# Patient Record
Sex: Female | Born: 1985 | Race: White | Hispanic: No | Marital: Married | Smoking: Never smoker
Health system: Southern US, Community
[De-identification: ages and names within clinical notes are randomized; demographics above are authoritative.]

## PROBLEM LIST (undated history)

## (undated) DIAGNOSIS — F909 Attention-deficit hyperactivity disorder, unspecified type: Secondary | ICD-10-CM

## (undated) HISTORY — DX: Attention-deficit hyperactivity disorder, unspecified type: F90.9

---
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
## 2011-03-09 ENCOUNTER — Emergency Department: Payer: Self-pay | Admitting: Emergency Medicine

## 2013-05-05 IMAGING — CT CT HEAD WITHOUT AND WITH CONTRAST
1 of 2 series · 13 of 30 positions shown, 17 images · IV contrast (agent unspecified)
Comparison: none

REASON FOR EXAM: acute change in vision both eyes, "severe headache (last
night), unsteady gait
COMMENTS:

PROCEDURE:     CT  - CT HEAD W/WO  - March 09, 2011  [DATE]
RESULT:
TECHNIQUE: Pre and post contrast CT of the brain is performed.
There is no previous exam for comparison.

[Series 2: without · axial · non-contrast · 0.39mm/px · z∈[-124,+12]mm · 13 of 33 slices shown, 17 images]
[im 3/33  brain]
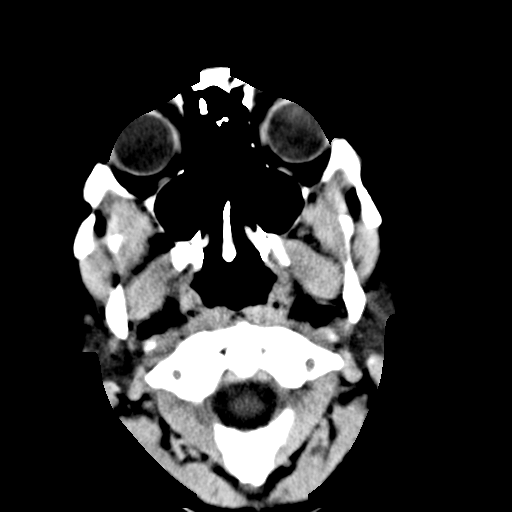
[im 3/33  bone]
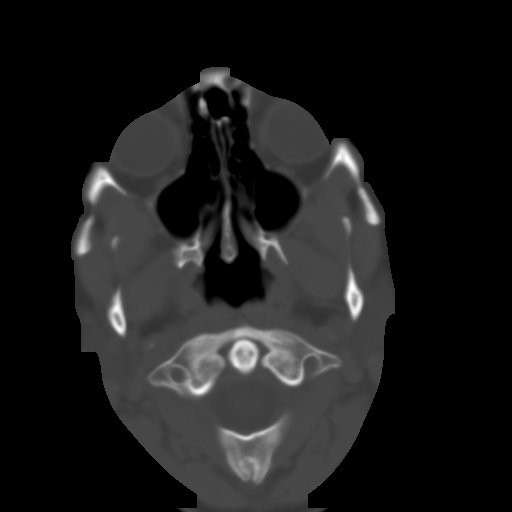
[im 5/33  brain]
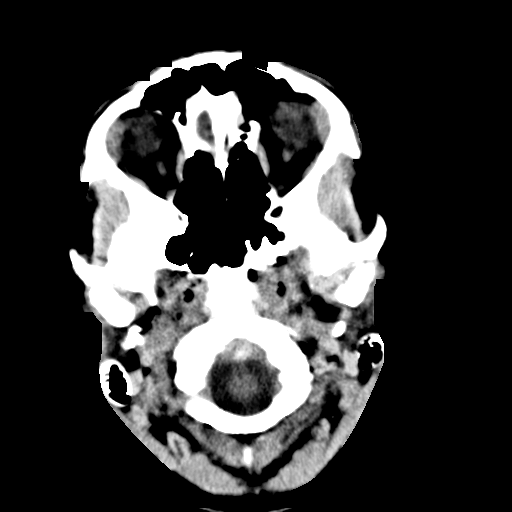
[im 7/33  brain]
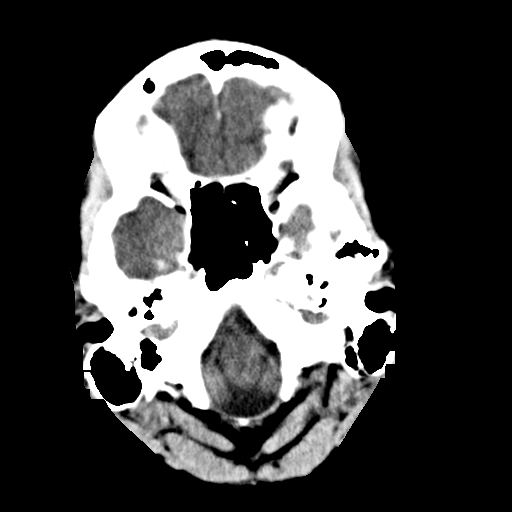
[im 10/33  brain]
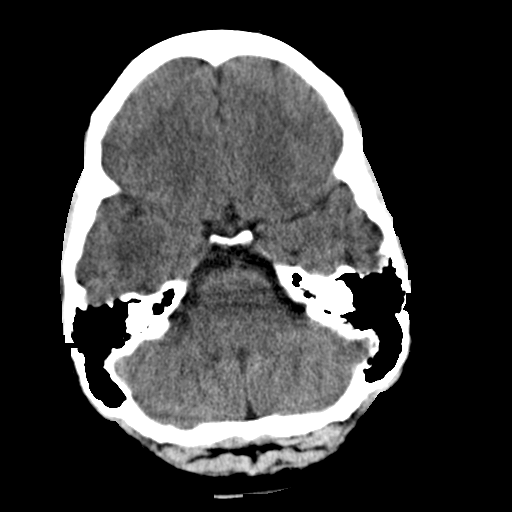
[im 12/33  brain]
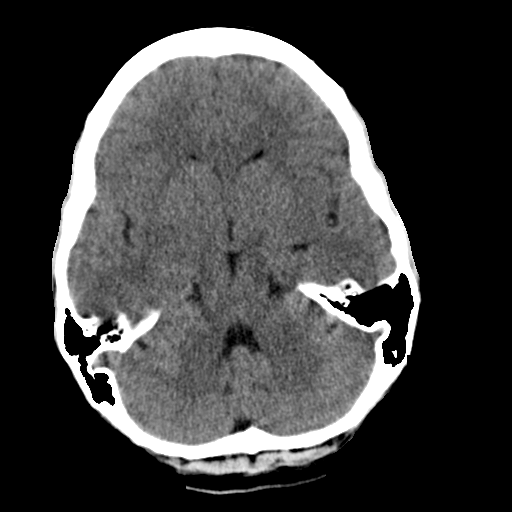
[im 12/33  bone]
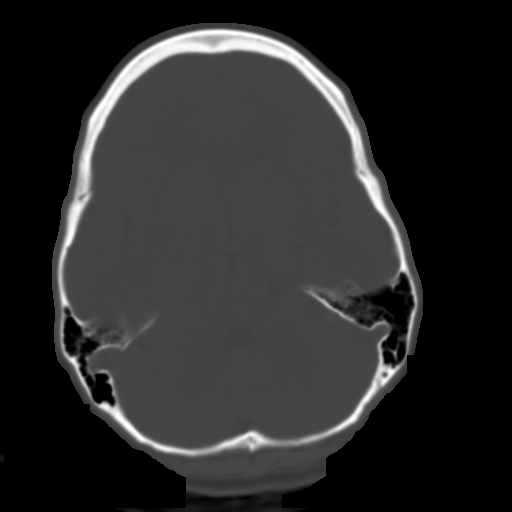
[im 14/33  brain]
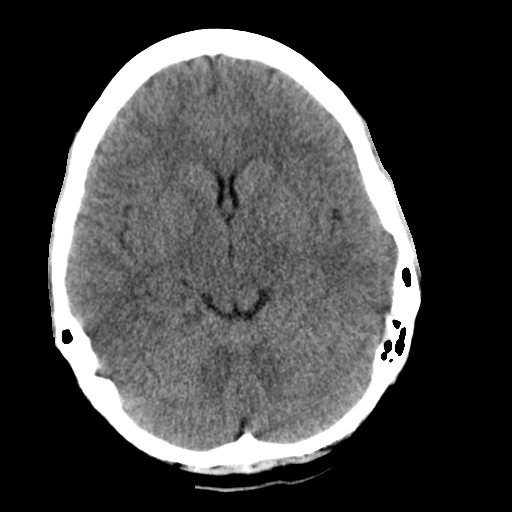
[im 17/33  brain]
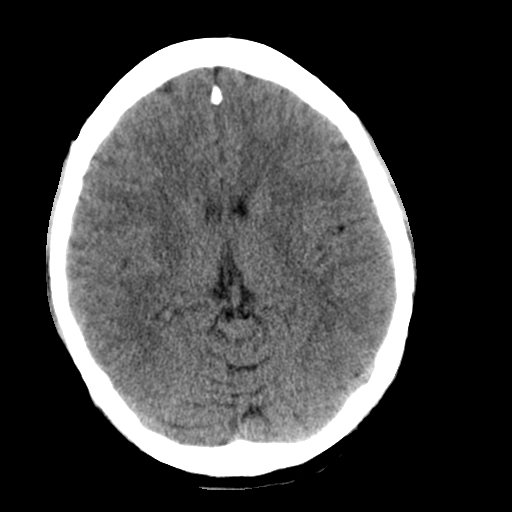
[im 19/33  brain]
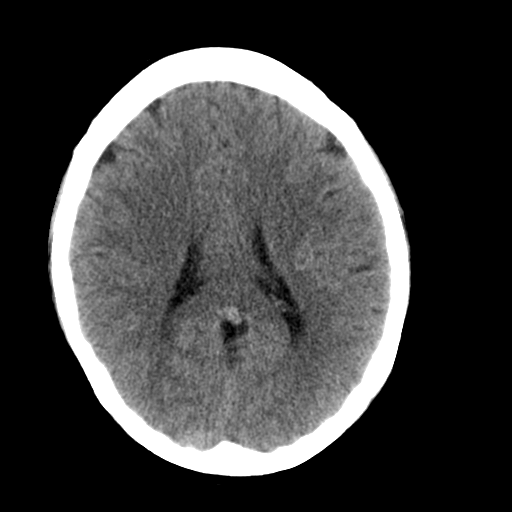
[im 21/33  brain]
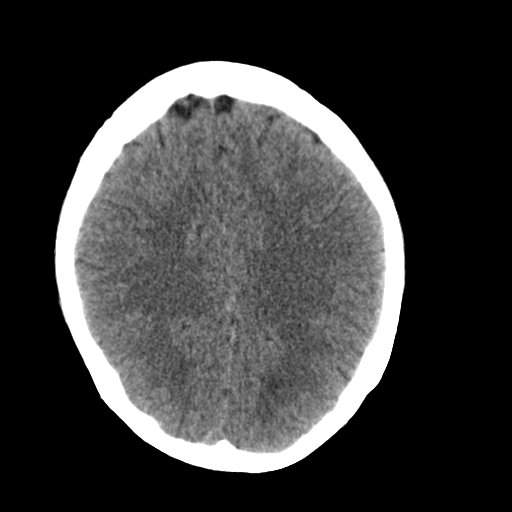
[im 21/33  bone]
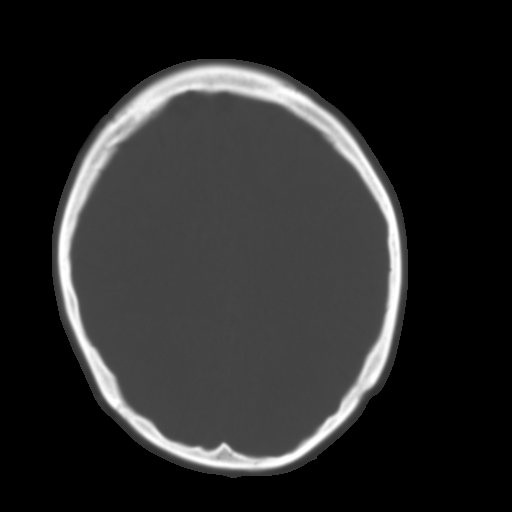
[im 23/33  brain]
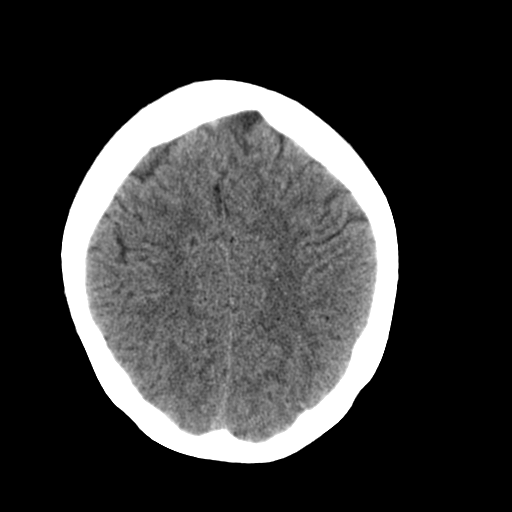
[im 26/33  brain]
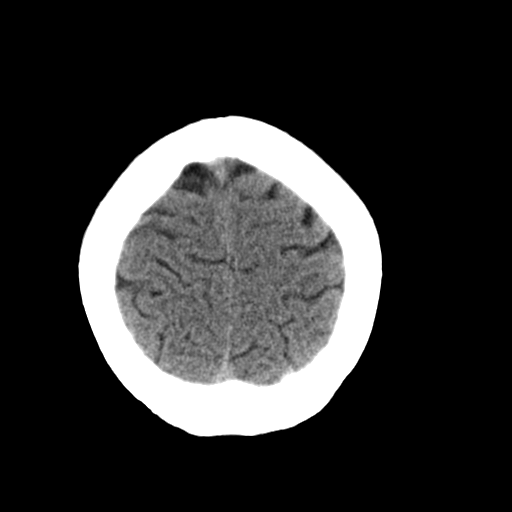
[im 28/33  brain]
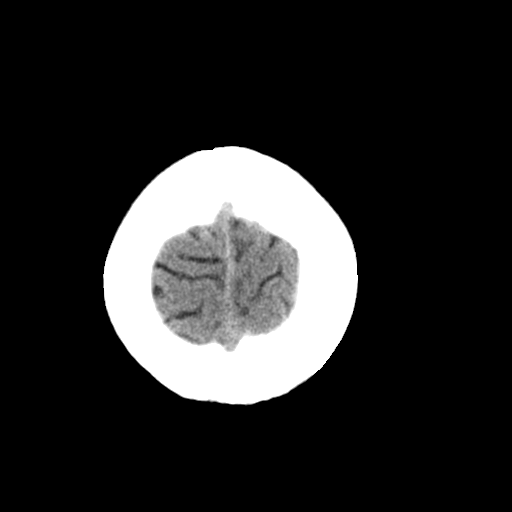
[im 30/33  brain]
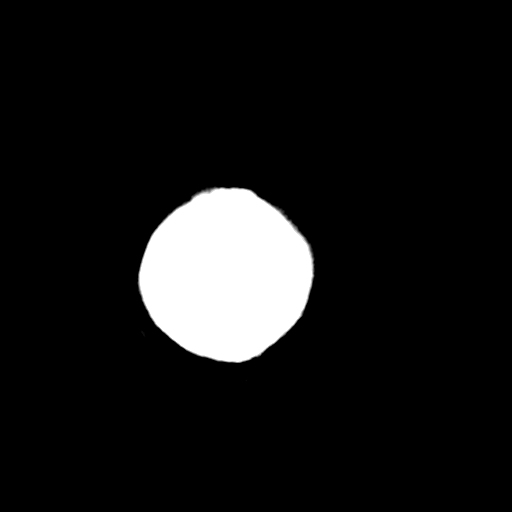
[im 30/33  bone]
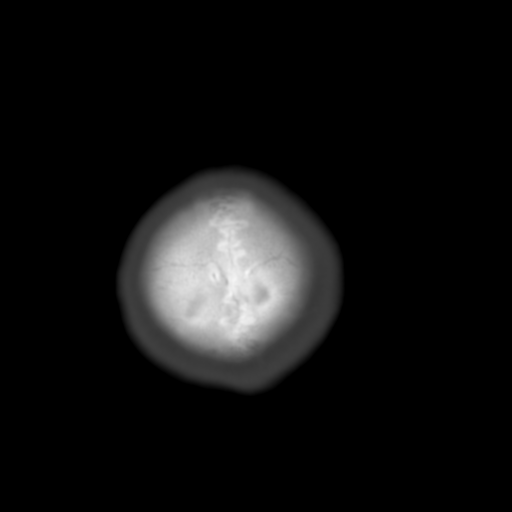

[13 of 30 positions shown; findings below may reference images not displayed]

FINDINGS: The sinuses are clear. The calvarium is intact. There is no
hemorrhage, mass, mass effect or midline shift. There is no territorial
infarct. No abnormal enhancement is present to suggest underlying mass.
There is no evidence of an aneurysm. Minimal calcification is seen along the
anterior falx cerebri.
IMPRESSION: No acute intracranial abnormality.

## 2014-11-13 ENCOUNTER — Encounter: Payer: Self-pay | Admitting: *Deleted

## 2014-11-18 ENCOUNTER — Encounter: Payer: Self-pay | Admitting: Obstetrics and Gynecology

## 2014-12-02 ENCOUNTER — Encounter: Payer: Self-pay | Admitting: Obstetrics and Gynecology

## 2014-12-02 ENCOUNTER — Other Ambulatory Visit: Payer: Self-pay | Admitting: Obstetrics and Gynecology

## 2014-12-02 ENCOUNTER — Ambulatory Visit (INDEPENDENT_AMBULATORY_CARE_PROVIDER_SITE_OTHER): Payer: 59 | Admitting: Obstetrics and Gynecology

## 2014-12-02 VITALS — BP 92/70 | HR 88 | Ht 66.0 in | Wt 149.5 lb

## 2014-12-02 DIAGNOSIS — Z01419 Encounter for gynecological examination (general) (routine) without abnormal findings: Secondary | ICD-10-CM

## 2014-12-02 NOTE — Patient Instructions (Signed)
  Place annual gynecologic exam patient instructions here.  Thank you for enrolling in [facility]. Please follow the instructions below to securely access your online medical record. MyChart allows you to send messages to your doctor, view your test results, manage appointments, and more.   How Do I Sign Up? 1. In your Internet browser, go to [employer] and enter [email or web address]. 2. Click on the Sign Up Now link in the Sign In box. You will see the New Member Sign Up page. 3. Enter your MyChart Access Code exactly as it appears below. You will not need to use this code after you've completed the sign-up process. If you do not sign up before the expiration date, you must request a new code.  MyChart Access Code: [email or web address] Expires: 01/31/2015  9:19 AM  4. Enter your Social Security Number ([id number]) and Date of Birth (mm/dd/yyyy) as indicated and click Submit. You will be taken to the next sign-up page. 5. Create a MyChart ID. This will be your MyChart login ID and cannot be changed, so think of one that is secure and easy to remember. 6. Create a MyChart password. You can change your password at any time. 7. Enter your Password Reset Question and Answer. This can be used at a later time if you forget your password.  8. Enter your e-mail address. You will receive e-mail notification when new information is available in [place]. 9. Click Sign Up. You can now view your medical record.   Additional Information Remember, MyChart is NOT to be used for urgent needs. For medical emergencies, dial 911.

## 2014-12-02 NOTE — Progress Notes (Signed)
  Subjective:     Ashley Mathis is a 29 y.o. female and is here for a comprehensive physical exam. The patient reports no problems.  Social History   Social History  . Marital Status: Married    Spouse Name: N/A  . Number of Children: N/A  . Years of Education: N/A   Occupational History  . Not on file.   Social History Main Topics  . Smoking status: Never Smoker   . Smokeless tobacco: Not on file  . Alcohol Use: Yes  . Drug Use: No  . Sexual Activity: Yes    Birth Control/ Protection: Condom   Other Topics Concern  . Not on file   Social History Narrative   Health Maintenance  Topic Date Due  . HIV Screening  06/23/2001  . TETANUS/TDAP  06/23/2005  . INFLUENZA VACCINE  11/10/2014    The following portions of the patient's history were reviewed and updated as appropriate: allergies, current medications, past family history, past medical history, past social history, past surgical history and problem list.  Review of Systems A comprehensive review of systems was negative.   Objective:    General appearance: alert, cooperative and appears stated age Neck: no adenopathy, no carotid bruit, no JVD, supple, symmetrical, trachea midline and thyroid not enlarged, symmetric, no tenderness/mass/nodules Lungs: clear to auscultation bilaterally Breasts: normal appearance, no masses or tenderness Heart: regular rate and rhythm, S1, S2 normal, no murmur, click, rub or gallop Abdomen: soft, non-tender; bowel sounds normal; no masses,  no organomegaly Pelvic: cervix normal in appearance, external genitalia normal, no adnexal masses or tenderness, no cervical motion tenderness, rectovaginal septum normal, uterus normal size, shape, and consistency and vagina normal without discharge    Assessment:    Healthy female exam. Condom use      Plan:  Routine pap obtained RTC 1 year or prn   See After Visit Summary for Counseling Recommendations

## 2014-12-03 LAB — CYTOLOGY - PAP

## 2014-12-04 ENCOUNTER — Encounter: Payer: Self-pay | Admitting: *Deleted

## 2015-07-06 DIAGNOSIS — D485 Neoplasm of uncertain behavior of skin: Secondary | ICD-10-CM | POA: Diagnosis not present

## 2015-07-06 DIAGNOSIS — D1801 Hemangioma of skin and subcutaneous tissue: Secondary | ICD-10-CM | POA: Diagnosis not present

## 2015-10-10 ENCOUNTER — Telehealth (HOSPITAL_BASED_OUTPATIENT_CLINIC_OR_DEPARTMENT_OTHER): Payer: Self-pay

## 2015-10-10 NOTE — Telephone Encounter (Signed)
Post ED Visit - Positive Culture Follow-up  Culture report reviewed by antimicrobial stewardship pharmacist:  []  Elenor Quinones, Pharm.D. []  Heide Guile, Pharm.D., BCPS []  Parks Neptune, Pharm.D. []  Alycia Rossetti, Pharm.D., BCPS []  [place], Pharm.D., BCPS, AAHIVP []  Legrand Como, Pharm.D., BCPS, AAHIVP [x]  Milus Glazier, Pharm.D. []  Stephens November, Pharm.D.  Positive urine culture Treated with Sulfamethoxazole-Trimethoprim, organism sensitive to the same and no further patient follow-up is required at this time.  Genia Del 10/10/2015, 2:25 PM

## 2015-12-03 ENCOUNTER — Encounter: Payer: 59 | Admitting: Obstetrics and Gynecology

## 2016-03-07 DIAGNOSIS — Z3161 Procreative counseling and advice using natural family planning: Secondary | ICD-10-CM | POA: Diagnosis not present

## 2016-03-07 DIAGNOSIS — Z01419 Encounter for gynecological examination (general) (routine) without abnormal findings: Secondary | ICD-10-CM | POA: Diagnosis not present

## 2016-05-07 ENCOUNTER — Telehealth: Payer: 59 | Admitting: Nurse Practitioner

## 2016-05-07 DIAGNOSIS — J111 Influenza due to unidentified influenza virus with other respiratory manifestations: Secondary | ICD-10-CM

## 2016-05-07 MED ORDER — OSELTAMIVIR PHOSPHATE 75 MG PO CAPS: 75.0000 mg | ORAL_CAPSULE | Freq: Two times a day (BID) | ORAL | 0 refills | Status: DC

## 2016-05-07 NOTE — Progress Notes (Signed)

## 2016-06-29 DIAGNOSIS — H5213 Myopia, bilateral: Secondary | ICD-10-CM | POA: Diagnosis not present

## 2016-08-26 DIAGNOSIS — Z0183 Encounter for blood typing: Secondary | ICD-10-CM | POA: Diagnosis not present

## 2016-08-26 DIAGNOSIS — Z3401 Encounter for supervision of normal first pregnancy, first trimester: Secondary | ICD-10-CM | POA: Diagnosis not present

## 2016-10-31 DIAGNOSIS — Z113 Encounter for screening for infections with a predominantly sexual mode of transmission: Secondary | ICD-10-CM | POA: Diagnosis not present

## 2016-10-31 DIAGNOSIS — Z118 Encounter for screening for other infectious and parasitic diseases: Secondary | ICD-10-CM | POA: Diagnosis not present

## 2016-10-31 DIAGNOSIS — Z3402 Encounter for supervision of normal first pregnancy, second trimester: Secondary | ICD-10-CM | POA: Diagnosis not present

## 2016-10-31 DIAGNOSIS — Z369 Encounter for antenatal screening, unspecified: Secondary | ICD-10-CM | POA: Diagnosis not present

## 2016-11-09 DIAGNOSIS — Z3A21 21 weeks gestation of pregnancy: Secondary | ICD-10-CM | POA: Diagnosis not present

## 2016-11-09 DIAGNOSIS — Z3689 Encounter for other specified antenatal screening: Secondary | ICD-10-CM | POA: Diagnosis not present

## 2016-12-27 DIAGNOSIS — Z131 Encounter for screening for diabetes mellitus: Secondary | ICD-10-CM | POA: Diagnosis not present

## 2016-12-27 DIAGNOSIS — Z3403 Encounter for supervision of normal first pregnancy, third trimester: Secondary | ICD-10-CM | POA: Diagnosis not present

## 2017-02-01 DIAGNOSIS — Z23 Encounter for immunization: Secondary | ICD-10-CM | POA: Diagnosis not present

## 2017-02-01 DIAGNOSIS — Z3403 Encounter for supervision of normal first pregnancy, third trimester: Secondary | ICD-10-CM | POA: Diagnosis not present

## 2017-02-07 DIAGNOSIS — M9902 Segmental and somatic dysfunction of thoracic region: Secondary | ICD-10-CM | POA: Diagnosis not present

## 2017-02-07 DIAGNOSIS — M461 Sacroiliitis, not elsewhere classified: Secondary | ICD-10-CM | POA: Diagnosis not present

## 2017-02-07 DIAGNOSIS — M5387 Other specified dorsopathies, lumbosacral region: Secondary | ICD-10-CM | POA: Diagnosis not present

## 2017-02-07 DIAGNOSIS — M9918 Subluxation complex (vertebral) of rib cage: Secondary | ICD-10-CM | POA: Diagnosis not present

## 2017-02-07 DIAGNOSIS — M9905 Segmental and somatic dysfunction of pelvic region: Secondary | ICD-10-CM | POA: Diagnosis not present

## 2017-02-07 DIAGNOSIS — M9904 Segmental and somatic dysfunction of sacral region: Secondary | ICD-10-CM | POA: Diagnosis not present

## 2017-02-07 DIAGNOSIS — M9903 Segmental and somatic dysfunction of lumbar region: Secondary | ICD-10-CM | POA: Diagnosis not present

## 2017-02-10 DIAGNOSIS — M9902 Segmental and somatic dysfunction of thoracic region: Secondary | ICD-10-CM | POA: Diagnosis not present

## 2017-02-10 DIAGNOSIS — M5387 Other specified dorsopathies, lumbosacral region: Secondary | ICD-10-CM | POA: Diagnosis not present

## 2017-02-10 DIAGNOSIS — M461 Sacroiliitis, not elsewhere classified: Secondary | ICD-10-CM | POA: Diagnosis not present

## 2017-02-10 DIAGNOSIS — M9918 Subluxation complex (vertebral) of rib cage: Secondary | ICD-10-CM | POA: Diagnosis not present

## 2017-02-10 DIAGNOSIS — M9904 Segmental and somatic dysfunction of sacral region: Secondary | ICD-10-CM | POA: Diagnosis not present

## 2017-02-10 DIAGNOSIS — M9903 Segmental and somatic dysfunction of lumbar region: Secondary | ICD-10-CM | POA: Diagnosis not present

## 2017-02-10 DIAGNOSIS — M9905 Segmental and somatic dysfunction of pelvic region: Secondary | ICD-10-CM | POA: Diagnosis not present

## 2017-02-13 DIAGNOSIS — M5387 Other specified dorsopathies, lumbosacral region: Secondary | ICD-10-CM | POA: Diagnosis not present

## 2017-02-13 DIAGNOSIS — M9903 Segmental and somatic dysfunction of lumbar region: Secondary | ICD-10-CM | POA: Diagnosis not present

## 2017-02-13 DIAGNOSIS — M461 Sacroiliitis, not elsewhere classified: Secondary | ICD-10-CM | POA: Diagnosis not present

## 2017-02-13 DIAGNOSIS — M9904 Segmental and somatic dysfunction of sacral region: Secondary | ICD-10-CM | POA: Diagnosis not present

## 2017-02-13 DIAGNOSIS — M9902 Segmental and somatic dysfunction of thoracic region: Secondary | ICD-10-CM | POA: Diagnosis not present

## 2017-02-13 DIAGNOSIS — M9905 Segmental and somatic dysfunction of pelvic region: Secondary | ICD-10-CM | POA: Diagnosis not present

## 2017-02-13 DIAGNOSIS — M9918 Subluxation complex (vertebral) of rib cage: Secondary | ICD-10-CM | POA: Diagnosis not present

## 2017-02-14 DIAGNOSIS — Z3403 Encounter for supervision of normal first pregnancy, third trimester: Secondary | ICD-10-CM | POA: Diagnosis not present

## 2017-02-14 DIAGNOSIS — Z3685 Encounter for antenatal screening for Streptococcus B: Secondary | ICD-10-CM | POA: Diagnosis not present

## 2017-02-14 DIAGNOSIS — Z113 Encounter for screening for infections with a predominantly sexual mode of transmission: Secondary | ICD-10-CM | POA: Diagnosis not present

## 2017-02-16 DIAGNOSIS — M9903 Segmental and somatic dysfunction of lumbar region: Secondary | ICD-10-CM | POA: Diagnosis not present

## 2017-02-16 DIAGNOSIS — M9904 Segmental and somatic dysfunction of sacral region: Secondary | ICD-10-CM | POA: Diagnosis not present

## 2017-02-16 DIAGNOSIS — M9918 Subluxation complex (vertebral) of rib cage: Secondary | ICD-10-CM | POA: Diagnosis not present

## 2017-02-16 DIAGNOSIS — M9905 Segmental and somatic dysfunction of pelvic region: Secondary | ICD-10-CM | POA: Diagnosis not present

## 2017-02-16 DIAGNOSIS — M5387 Other specified dorsopathies, lumbosacral region: Secondary | ICD-10-CM | POA: Diagnosis not present

## 2017-02-16 DIAGNOSIS — M461 Sacroiliitis, not elsewhere classified: Secondary | ICD-10-CM | POA: Diagnosis not present

## 2017-02-16 DIAGNOSIS — M9902 Segmental and somatic dysfunction of thoracic region: Secondary | ICD-10-CM | POA: Diagnosis not present

## 2017-02-21 DIAGNOSIS — M9903 Segmental and somatic dysfunction of lumbar region: Secondary | ICD-10-CM | POA: Diagnosis not present

## 2017-02-21 DIAGNOSIS — M5387 Other specified dorsopathies, lumbosacral region: Secondary | ICD-10-CM | POA: Diagnosis not present

## 2017-02-21 DIAGNOSIS — M461 Sacroiliitis, not elsewhere classified: Secondary | ICD-10-CM | POA: Diagnosis not present

## 2017-02-21 DIAGNOSIS — M9902 Segmental and somatic dysfunction of thoracic region: Secondary | ICD-10-CM | POA: Diagnosis not present

## 2017-02-21 DIAGNOSIS — M9905 Segmental and somatic dysfunction of pelvic region: Secondary | ICD-10-CM | POA: Diagnosis not present

## 2017-02-21 DIAGNOSIS — M9918 Subluxation complex (vertebral) of rib cage: Secondary | ICD-10-CM | POA: Diagnosis not present

## 2017-02-21 DIAGNOSIS — M9904 Segmental and somatic dysfunction of sacral region: Secondary | ICD-10-CM | POA: Diagnosis not present

## 2017-02-23 DIAGNOSIS — M5387 Other specified dorsopathies, lumbosacral region: Secondary | ICD-10-CM | POA: Diagnosis not present

## 2017-02-23 DIAGNOSIS — M461 Sacroiliitis, not elsewhere classified: Secondary | ICD-10-CM | POA: Diagnosis not present

## 2017-02-23 DIAGNOSIS — M9904 Segmental and somatic dysfunction of sacral region: Secondary | ICD-10-CM | POA: Diagnosis not present

## 2017-02-23 DIAGNOSIS — M9918 Subluxation complex (vertebral) of rib cage: Secondary | ICD-10-CM | POA: Diagnosis not present

## 2017-02-23 DIAGNOSIS — M9902 Segmental and somatic dysfunction of thoracic region: Secondary | ICD-10-CM | POA: Diagnosis not present

## 2017-02-23 DIAGNOSIS — M9905 Segmental and somatic dysfunction of pelvic region: Secondary | ICD-10-CM | POA: Diagnosis not present

## 2017-02-23 DIAGNOSIS — M9903 Segmental and somatic dysfunction of lumbar region: Secondary | ICD-10-CM | POA: Diagnosis not present

## 2017-02-27 DIAGNOSIS — M9904 Segmental and somatic dysfunction of sacral region: Secondary | ICD-10-CM | POA: Diagnosis not present

## 2017-02-27 DIAGNOSIS — M9903 Segmental and somatic dysfunction of lumbar region: Secondary | ICD-10-CM | POA: Diagnosis not present

## 2017-02-27 DIAGNOSIS — M5387 Other specified dorsopathies, lumbosacral region: Secondary | ICD-10-CM | POA: Diagnosis not present

## 2017-02-27 DIAGNOSIS — M9905 Segmental and somatic dysfunction of pelvic region: Secondary | ICD-10-CM | POA: Diagnosis not present

## 2017-02-27 DIAGNOSIS — M461 Sacroiliitis, not elsewhere classified: Secondary | ICD-10-CM | POA: Diagnosis not present

## 2017-02-27 DIAGNOSIS — M9902 Segmental and somatic dysfunction of thoracic region: Secondary | ICD-10-CM | POA: Diagnosis not present

## 2017-02-27 DIAGNOSIS — M9918 Subluxation complex (vertebral) of rib cage: Secondary | ICD-10-CM | POA: Diagnosis not present

## 2017-03-09 DIAGNOSIS — O321XX0 Maternal care for breech presentation, not applicable or unspecified: Secondary | ICD-10-CM | POA: Diagnosis not present

## 2017-03-09 DIAGNOSIS — Z3A38 38 weeks gestation of pregnancy: Secondary | ICD-10-CM | POA: Diagnosis not present

## 2017-03-13 DIAGNOSIS — M9905 Segmental and somatic dysfunction of pelvic region: Secondary | ICD-10-CM | POA: Diagnosis not present

## 2017-03-13 DIAGNOSIS — M461 Sacroiliitis, not elsewhere classified: Secondary | ICD-10-CM | POA: Diagnosis not present

## 2017-03-13 DIAGNOSIS — M9902 Segmental and somatic dysfunction of thoracic region: Secondary | ICD-10-CM | POA: Diagnosis not present

## 2017-03-13 DIAGNOSIS — M9918 Subluxation complex (vertebral) of rib cage: Secondary | ICD-10-CM | POA: Diagnosis not present

## 2017-03-13 DIAGNOSIS — M9904 Segmental and somatic dysfunction of sacral region: Secondary | ICD-10-CM | POA: Diagnosis not present

## 2017-03-13 DIAGNOSIS — M5387 Other specified dorsopathies, lumbosacral region: Secondary | ICD-10-CM | POA: Diagnosis not present

## 2017-03-13 DIAGNOSIS — M9903 Segmental and somatic dysfunction of lumbar region: Secondary | ICD-10-CM | POA: Diagnosis not present

## 2017-04-10 DIAGNOSIS — J069 Acute upper respiratory infection, unspecified: Secondary | ICD-10-CM | POA: Diagnosis not present

## 2017-05-08 DIAGNOSIS — Z3009 Encounter for other general counseling and advice on contraception: Secondary | ICD-10-CM | POA: Diagnosis not present

## 2017-05-08 DIAGNOSIS — Z01419 Encounter for gynecological examination (general) (routine) without abnormal findings: Secondary | ICD-10-CM | POA: Diagnosis not present

## 2018-07-09 DIAGNOSIS — Z3481 Encounter for supervision of other normal pregnancy, first trimester: Secondary | ICD-10-CM | POA: Diagnosis not present

## 2018-07-24 DIAGNOSIS — Z113 Encounter for screening for infections with a predominantly sexual mode of transmission: Secondary | ICD-10-CM | POA: Diagnosis not present

## 2018-07-24 DIAGNOSIS — Z3481 Encounter for supervision of other normal pregnancy, first trimester: Secondary | ICD-10-CM | POA: Diagnosis not present

## 2018-07-25 DIAGNOSIS — O021 Missed abortion: Secondary | ICD-10-CM | POA: Diagnosis not present

## 2018-07-25 DIAGNOSIS — O3680X0 Pregnancy with inconclusive fetal viability, not applicable or unspecified: Secondary | ICD-10-CM | POA: Diagnosis not present

## 2018-08-01 ENCOUNTER — Inpatient Hospital Stay (HOSPITAL_COMMUNITY): Payer: 59

## 2018-08-01 ENCOUNTER — Encounter (HOSPITAL_COMMUNITY): Payer: Self-pay

## 2018-08-01 ENCOUNTER — Inpatient Hospital Stay (HOSPITAL_COMMUNITY)
Admission: AD | Admit: 2018-08-01 | Discharge: 2018-08-01 | Disposition: A | Discharge status: Home / Self Care | Payer: 59 | Attending: Family Medicine | Admitting: Family Medicine

## 2018-08-01 ENCOUNTER — Other Ambulatory Visit: Payer: Self-pay

## 2018-08-01 DIAGNOSIS — N939 Abnormal uterine and vaginal bleeding, unspecified: Secondary | ICD-10-CM | POA: Insufficient documentation

## 2018-08-01 DIAGNOSIS — R102 Pelvic and perineal pain: Secondary | ICD-10-CM | POA: Diagnosis not present

## 2018-08-01 DIAGNOSIS — R42 Dizziness and giddiness: Secondary | ICD-10-CM | POA: Insufficient documentation

## 2018-08-01 DIAGNOSIS — R109 Unspecified abdominal pain: Secondary | ICD-10-CM | POA: Diagnosis not present

## 2018-08-01 DIAGNOSIS — Z3A00 Weeks of gestation of pregnancy not specified: Secondary | ICD-10-CM | POA: Diagnosis not present

## 2018-08-01 DIAGNOSIS — O039 Complete or unspecified spontaneous abortion without complication: Secondary | ICD-10-CM | POA: Insufficient documentation

## 2018-08-01 LAB — ABO/RH: ABO/RH(D): O POS

## 2018-08-01 LAB — CBC
HCT: 36.1 % (ref 36.0–46.0)
Hemoglobin: 12.6 g/dL (ref 12.0–15.0)
MCH: 30.4 pg (ref 26.0–34.0)
MCHC: 34.9 g/dL (ref 30.0–36.0)
MCV: 87.2 fL (ref 80.0–100.0)
Platelets: 182 10*3/uL (ref 150–400)
RBC: 4.14 MIL/uL (ref 3.87–5.11)
RDW: 12.6 % (ref 11.5–15.5)
WBC: 6.5 10*3/uL (ref 4.0–10.5)
nRBC: 0 % (ref 0.0–0.2)

## 2018-08-01 LAB — TYPE AND SCREEN
ABO/RH(D): O POS
Antibody Screen: NEGATIVE

## 2018-08-01 LAB — SAMPLE TO BLOOD BANK

## 2018-08-01 MED ORDER — LACTATED RINGERS IV SOLN
INTRAVENOUS | Status: DC
  Administered 2018-08-01: 07:00:00 via INTRAVENOUS

## 2018-08-01 MED ORDER — LACTATED RINGERS IV SOLN
INTRAVENOUS | Status: DC
  Administered 2018-08-01: 08:00:00 via INTRAVENOUS

## 2018-08-01 MED ORDER — MISOPROSTOL 200 MCG PO TABS
800.0000 ug | ORAL_TABLET | Freq: Once | ORAL | Status: AC
  Administered 2018-08-01: 08:00:00 800 ug via ORAL
  Filled 2018-08-01: qty 4

## 2018-08-01 NOTE — MAU Provider Note (Addendum)
Chief Complaint:  Vaginal Bleeding   First Provider Initiated Contact with Patient 08/01/18 [phone]      HPI: Ashley Mathis is a 33 y.o. G2P1010 who presents to maternity admissions reporting miscarriage which occurred at home.  She had a known missed abortion and was scheduled to have a D&E tomorrow.  States she passed the baby and possibly the placenta at home but continues to bleed heavily.  Feels a little lightheaded. . She reports vaginal bleeding, but no vaginal itching/burning, urinary symptoms, h/a, dizziness, n/v, or fever/chills.    RN Note: Was scheduled for a D&E for missed AB tomorrow.  Woke up at 0500 to heavy vaginal bleeding and states she passed the baby shortly after that.  Has continued to have severe bleeding since then.  Thinks she may have passed the placenta? Unsure-still passing many large clots.  Feels slightly light headed.  Cramping has increased in the past 15 minutes.  Hasn't taken anything for her pain.    Past Medical History: No past medical history on file.  Past obstetric history: OB History  Gravida Para Term Preterm AB Living  3 1 1   1     SAB TAB Ectopic Multiple Live Births  1            # Outcome Date GA Lbr Len/2nd Weight Sex Delivery Anes PTL Lv  3 Current           2 Term 03/28/17          1 SAB  [redacted]w[redacted]d           Past Surgical History: No past surgical history on file.  Family History: Family History  Problem Relation Age of Onset  . Diabetes Maternal Grandfather   . Heart disease Paternal Grandfather     Social History: Social History   Tobacco Use  . Smoking status: Never Smoker  Substance Use Topics  . Alcohol use: Yes  . Drug use: No    Allergies: No Known Allergies  Meds:  No medications prior to admission.    I have reviewed patient's Past Medical Hx, Surgical Hx, Family Hx, Social Hx, medications and allergies.  ROS:  Review of Systems  Constitutional: Negative for chills and fever.  Respiratory: Negative for  shortness of breath.   Gastrointestinal: Positive for abdominal pain. Negative for constipation, diarrhea and nausea.  Genitourinary: Positive for pelvic pain and vaginal bleeding.  Neurological: Positive for light-headedness.   Other systems negative    Physical Exam   Patient Vitals for the past 24 hrs:  BP Temp Temp src Pulse Resp SpO2  08/01/18 1032 (!) 111/52 - - 96 18 100 %  08/01/18 0822 (!) 96/58 98.2 F (36.8 C) Oral 94 18 100 %  08/01/18 0642 106/64 - - (!) 122 - -  08/01/18 0627 114/70 98.5 F (36.9 C) - (!) 126 19 99 %     Constitutional: Well-developed, well-nourished female in no acute distress.  Cardiovascular: tachycardic, normal rhythm Respiratory: normal effort, no distress.  GI: Abd soft, non-tender.  Nondistended.  No rebound, No guarding.  Bowel Sounds audible  MS: Extremities nontender, no edema, normal ROM Neurologic: Alert and oriented x 4.   Grossly nonfocal. GU: Neg CVAT. Skin:  Warm and Dry Psych:  Affect appropriate.  PELVIC EXAM: Speculum exam showed large mass of clots in vagina.  Moderate active bleeding   Clots and some tissue evacuated, approximately 256ml.   Some membranes still at cervical os.  Labs: Will check CBC and Type/screen.   --/--/O POS, O POS Performed at [facility], [place] [address] [place], [place] [address]  [phone])  Results for orders placed or performed during the hospital encounter of 08/01/18 (from the past 24 hour(s))  CBC     Status: None   Collection Time: 08/01/18  6:53 AM  Result Value Ref Range   WBC 6.5 4.0 - 10.5 K/uL   RBC 4.14 3.87 - 5.11 MIL/uL   Hemoglobin 12.6 12.0 - 15.0 g/dL   HCT 36.1 36.0 - 46.0 %   MCV 87.2 80.0 - 100.0 fL   MCH 30.4 26.0 - 34.0 pg   MCHC 34.9 30.0 - 36.0 g/dL   RDW 12.6 11.5 - 15.5 %   Platelets 182 150 - 400 K/uL   nRBC 0.0 0.0 - 0.2 %  Sample to Blood Bank     Status: None   Collection Time: 08/01/18  6:53 AM  Result Value Ref Range   Blood Bank Specimen  SAMPLE AVAILABLE FOR TESTING    Sample Expiration      08/02/2018 Performed at [facility], [address] [place], [place] [address]   Type and screen     Status: None   Collection Time: 08/01/18  6:53 AM  Result Value Ref Range   ABO/RH(D) O POS    Antibody Screen NEG    Sample Expiration      08/04/2018 Performed at [facility], [place] [address] [place], [place] [address]   ABO/Rh     Status: None (Preliminary result)   Collection Time: 08/01/18  6:53 AM  Result Value Ref Range   ABO/RH(D)      O POS Performed at [facility] [address] [place], [place] [address]      Imaging:  [place] pending  MAU Course/MDM: I have ordered labs as follows: See above.  IV started for rehydration and possible surgery or transfusion if necessary. Imaging ordered: ultrasound to rule out retained products of conception   Consult Dr Nehemiah Settle  Will give Cytotec to start with.   Treatments in MAU included Cytotec, [place].    Assessment: 1. SAB (spontaneous abortion)     Plan: Cytotec for passage of remaining placental tissue.  Follow-up Information    Your ob/gyn in [facility] Follow up.   Why:  Call for follow up appointment        Observe for resolution vs possible D&E  Report given to oncoming provider  Hansel Feinstein CNM, MSN Certified Nurse-Midwife 08/01/2018 6:46 AM   RH positive Pt given cytotec & observed x 2 hours. Reports passing a large clot into the toilet. Otherwise minimal bleeding on pad. Vital signs improved.  C/w Dr. Rosana Hoes. Ok to discharge home. Patient states she has been in contact with her ob/gyn this morning & that she will follow up with them next week.   A:  1. SAB (spontaneous abortion)    P: Discharge home Discussed reasons to return to MAU Keep f/u with ob/gyn  Jorje Guild, NP

## 2018-08-01 NOTE — Discharge Instructions (Signed)
Return to care   If you have heavier bleeding that soaks through more that 2 pads per hour for an hour or more  If you bleed so much that you feel like you might pass out or you do pass out  If you have significant abdominal pain that is not improved with Tylenol   If you develop a fever > 100.5      Miscarriage A miscarriage is the loss of an unborn baby (fetus) before the 20th week of pregnancy. Most miscarriages happen during the first 3 months of pregnancy. Sometimes, a miscarriage can happen before a woman knows that she is pregnant. Having a miscarriage can be an emotional experience. If you have had a miscarriage, talk with your health care provider about any questions you may have about miscarrying, the grieving process, and your plans for future pregnancy. What are the causes? A miscarriage may be caused by:  Problems with the genes or chromosomes of the fetus. These problems make it impossible for the baby to develop normally. They are often the result of random errors that occur early in the development of the baby, and are not passed from parent to child (not inherited).  Infection of the cervix or uterus.  Conditions that affect hormone balance in the body.  Problems with the cervix, such as the cervix opening and thinning before pregnancy is at term (cervical insufficiency).  Problems with the uterus. These may include: ? A uterus with an abnormal shape. ? Fibroids in the uterus. ? Congenital abnormalities. These are problems that were present at birth.  Certain medical conditions.  Smoking, drinking alcohol, or using drugs.  Injury (trauma). In many cases, the cause of a miscarriage is not known. What are the signs or symptoms? Symptoms of this condition include:  Vaginal bleeding or spotting, with or without cramps or pain.  Pain or cramping in the abdomen or lower back.  Passing fluid, tissue, or blood clots from the vagina. How is this diagnosed? This  condition may be diagnosed based on:  A physical exam.  Ultrasound.  Blood tests.  Urine tests. How is this treated? Treatment for a miscarriage is sometimes not necessary if you naturally pass all the tissue that was in your uterus. If necessary, this condition may be treated with:  Dilation and curettage (D&C). This is a procedure in which the cervix is stretched open and the lining of the uterus (endometrium) is scraped. This is done only if tissue from the fetus or placenta remains in the body (incomplete miscarriage).  Medicines, such as: ? Antibiotic medicine, to treat infection. ? Medicine to help the body pass any remaining tissue. ? Medicine to reduce (contract) the size of the uterus. These medicines may be given if you have a lot of bleeding. If you have Rh negative blood and your baby was Rh positive, you will need a shot of a medicine called Rh immunoglobulinto protect your future babies from Rh blood problems. "Rh-negative" and "Rh-positive" refer to whether or not the blood has a specific protein found on the surface of red blood cells (Rh factor). Follow these instructions at home: Medicines   Take over-the-counter and prescription medicines only as told by your health care provider.  If you were prescribed antibiotic medicine, take it as told by your health care provider. Do not stop taking the antibiotic even if you start to feel better.  Do not take NSAIDs, such as aspirin and ibuprofen, unless they are approved by your health  care provider. These medicines can cause bleeding. Activity  Rest as directed. Ask your health care provider what activities are safe for you.  Have someone help with home and family responsibilities during this time. General instructions  Keep track of the number of sanitary pads you use each day and how soaked (saturated) they are. Write down this information.  Monitor the amount of tissue or blood clots that you pass from your vagina.  Save any large amounts of tissue for your health care provider to examine.  Do not use tampons, douche, or have sex until your health care provider approves.  To help you and your partner with the process of grieving, talk with your health care provider or seek counseling.  When you are ready, meet with your health care provider to discuss any important steps you should take for your health. Also, discuss steps you should take to have a healthy pregnancy in the future.  Keep all follow-up visits as told by your health care provider. This is important. Where to find more information  The American Congress of Obstetricians and Gynecologists: www.acog.org  U.S. Department of Health and Programmer, systems of Womens Health: [place] Contact a health care provider if:  You have a fever or chills.  You have a foul smelling vaginal discharge.  You have more bleeding instead of less. Get help right away if:  You have severe cramps or pain in your back or abdomen.  You pass blood clots or tissue from your vagina that is walnut-sized or larger.  You soak more than 1 regular sanitary pad in an hour.  You become light-headed or weak.  You pass out.  You have feelings of sadness that take over your thoughts, or you have thoughts of hurting yourself. Summary  Most miscarriages happen in the first 3 months of pregnancy. Sometimes miscarriage happens before a woman even knows that she is pregnant.  Follow your health care provider's instruction for home care. Keep all follow-up appointments.  To help you and your partner with the process of grieving, talk with your health care provider or seek counseling. This information is not intended to replace advice given to you by your health care provider. Make sure you discuss any questions you have with your health care provider. Document Released: 09/21/2000 Document Revised: 05/03/2016 Document Reviewed: 05/03/2016 Elsevier  Interactive Patient Education  2019 [employer].

## 2018-08-01 NOTE — MAU Note (Signed)
Was scheduled for a D&E for missed AB tomorrow.  Woke up at 0500 to heavy vaginal bleeding and states she passed the baby shortly after that.  Has continued to have severe bleeding since then.  Thinks she may have passed the placenta? Unsure-still passing many large clots.  Feels slightly light headed.  Cramping has increased in the past 15 minutes.  Hasn't taken anything for her pain.

## 2018-08-13 DIAGNOSIS — R350 Frequency of micturition: Secondary | ICD-10-CM | POA: Diagnosis not present

## 2018-08-13 DIAGNOSIS — O039 Complete or unspecified spontaneous abortion without complication: Secondary | ICD-10-CM | POA: Diagnosis not present

## 2018-08-31 DIAGNOSIS — O034 Incomplete spontaneous abortion without complication: Secondary | ICD-10-CM | POA: Diagnosis not present

## 2019-03-20 DIAGNOSIS — O021 Missed abortion: Secondary | ICD-10-CM | POA: Diagnosis not present

## 2019-03-20 DIAGNOSIS — R350 Frequency of micturition: Secondary | ICD-10-CM | POA: Diagnosis not present

## 2019-03-20 DIAGNOSIS — N939 Abnormal uterine and vaginal bleeding, unspecified: Secondary | ICD-10-CM | POA: Diagnosis not present

## 2019-03-20 DIAGNOSIS — Z01419 Encounter for gynecological examination (general) (routine) without abnormal findings: Secondary | ICD-10-CM | POA: Diagnosis not present

## 2019-03-25 DIAGNOSIS — Z31430 Encounter of female for testing for genetic disease carrier status for procreative management: Secondary | ICD-10-CM | POA: Diagnosis not present

## 2019-03-25 DIAGNOSIS — Z1371 Encounter for nonprocreative screening for genetic disease carrier status: Secondary | ICD-10-CM | POA: Diagnosis not present

## 2019-03-25 DIAGNOSIS — O039 Complete or unspecified spontaneous abortion without complication: Secondary | ICD-10-CM | POA: Diagnosis not present

## 2019-05-01 DIAGNOSIS — N911 Secondary amenorrhea: Secondary | ICD-10-CM | POA: Diagnosis not present

## 2019-05-24 DIAGNOSIS — Z3481 Encounter for supervision of other normal pregnancy, first trimester: Secondary | ICD-10-CM | POA: Diagnosis not present

## 2019-06-06 DIAGNOSIS — O3680X0 Pregnancy with inconclusive fetal viability, not applicable or unspecified: Secondary | ICD-10-CM | POA: Diagnosis not present

## 2019-06-13 DIAGNOSIS — Z131 Encounter for screening for diabetes mellitus: Secondary | ICD-10-CM | POA: Diagnosis not present

## 2019-06-13 DIAGNOSIS — Z113 Encounter for screening for infections with a predominantly sexual mode of transmission: Secondary | ICD-10-CM | POA: Diagnosis not present

## 2019-06-13 DIAGNOSIS — Z369 Encounter for antenatal screening, unspecified: Secondary | ICD-10-CM | POA: Diagnosis not present

## 2019-08-07 DIAGNOSIS — Z363 Encounter for antenatal screening for malformations: Secondary | ICD-10-CM | POA: Diagnosis not present

## 2019-08-07 DIAGNOSIS — Z36 Encounter for antenatal screening for chromosomal anomalies: Secondary | ICD-10-CM | POA: Diagnosis not present

## 2019-08-07 DIAGNOSIS — Z3A19 19 weeks gestation of pregnancy: Secondary | ICD-10-CM | POA: Diagnosis not present

## 2019-10-09 DIAGNOSIS — Z131 Encounter for screening for diabetes mellitus: Secondary | ICD-10-CM | POA: Diagnosis not present

## 2019-10-09 DIAGNOSIS — Z369 Encounter for antenatal screening, unspecified: Secondary | ICD-10-CM | POA: Diagnosis not present

## 2019-10-16 DIAGNOSIS — O99810 Abnormal glucose complicating pregnancy: Secondary | ICD-10-CM | POA: Diagnosis not present

## 2019-10-16 DIAGNOSIS — Z3483 Encounter for supervision of other normal pregnancy, third trimester: Secondary | ICD-10-CM | POA: Diagnosis not present

## 2019-10-16 DIAGNOSIS — Z131 Encounter for screening for diabetes mellitus: Secondary | ICD-10-CM | POA: Diagnosis not present

## 2019-12-06 DIAGNOSIS — Z3483 Encounter for supervision of other normal pregnancy, third trimester: Secondary | ICD-10-CM | POA: Diagnosis not present

## 2019-12-06 DIAGNOSIS — Z23 Encounter for immunization: Secondary | ICD-10-CM | POA: Diagnosis not present

## 2019-12-06 DIAGNOSIS — Z3685 Encounter for antenatal screening for Streptococcus B: Secondary | ICD-10-CM | POA: Diagnosis not present

## 2019-12-06 DIAGNOSIS — Z369 Encounter for antenatal screening, unspecified: Secondary | ICD-10-CM | POA: Diagnosis not present

## 2020-01-03 DIAGNOSIS — Z3A40 40 weeks gestation of pregnancy: Secondary | ICD-10-CM | POA: Diagnosis not present

## 2020-01-03 DIAGNOSIS — O480 Post-term pregnancy: Secondary | ICD-10-CM | POA: Diagnosis not present

## 2020-01-20 DIAGNOSIS — Z23 Encounter for immunization: Secondary | ICD-10-CM | POA: Diagnosis not present

## 2020-02-25 DIAGNOSIS — Z30430 Encounter for insertion of intrauterine contraceptive device: Secondary | ICD-10-CM | POA: Diagnosis not present

## 2020-03-11 DIAGNOSIS — Z20822 Contact with and (suspected) exposure to covid-19: Secondary | ICD-10-CM | POA: Diagnosis not present

## 2020-08-19 ENCOUNTER — Ambulatory Visit: Payer: 59 | Attending: Internal Medicine

## 2020-08-19 DIAGNOSIS — Z23 Encounter for immunization: Secondary | ICD-10-CM

## 2020-08-19 NOTE — Progress Notes (Signed)
   Covid-19 Vaccination Clinic  Name:  Ashley Mathis    MRN: [id number] DOB: 05/25/1985  08/19/2020  [name] was observed post Covid-19 immunization for 15 minutes without incident. She was provided with Vaccine Information Sheet and instruction to access the V-Safe system.   [name] was instructed to call 911 with any severe reactions post vaccine: Marland Kitchen Difficulty breathing  . Swelling of face and throat  . A fast heartbeat  . A bad rash all over body  . Dizziness and weakness   Immunizations Administered    Name Date Dose VIS Date Route   PFIZER Comrnaty(Gray TOP) Covid-19 Vaccine 08/19/2020 12:40 PM 0.3 mL 03/19/2020 Intramuscular   Manufacturer: [place]   Lot: [id number]   NDC: [phone]

## 2020-08-24 ENCOUNTER — Other Ambulatory Visit (HOSPITAL_COMMUNITY): Payer: Self-pay

## 2020-08-24 MED ORDER — COVID-19 MRNA VAC-TRIS(PFIZER) 30 MCG/0.3ML IM SUSP
INTRAMUSCULAR | 0 refills | Status: DC
  Filled 2020-08-24: qty 0.3, 17d supply, fill #0

## 2020-09-27 IMAGING — US OBSTETRIC <14 WK ULTRASOUND
1 series · 15 of 28 positions shown · non-contrast
Comparison: None.

CLINICAL DATA: Vaginal bleeding with reported spontaneous abortion
earlier today

EXAM:
OBSTETRIC <14 WK US AND TRANSVAGINAL OB US
TECHNIQUE: Both transabdominal and transvaginal ultrasound examinations were
performed for complete evaluation of the gestation as well as the
maternal uterus, adnexal regions, and pelvic cul-de-sac.
Transvaginal technique was performed to assess early pregnancy.

[Series 1: obstetric <14 wk ultrasound · 15 of 28 slices shown]
[im 1/28]
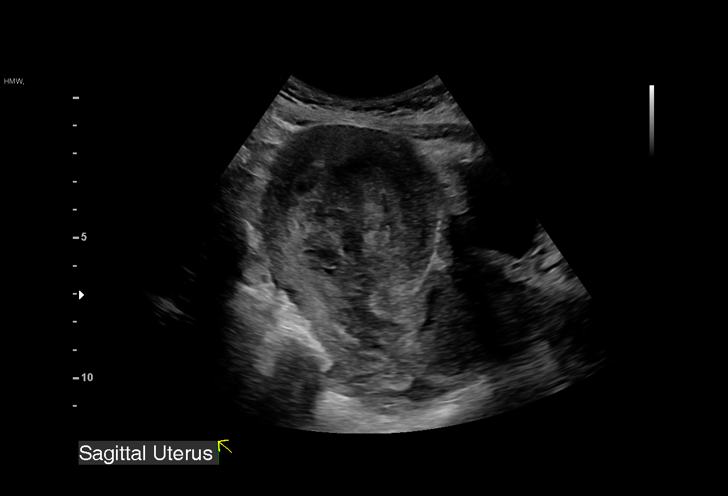
[im 3/28]
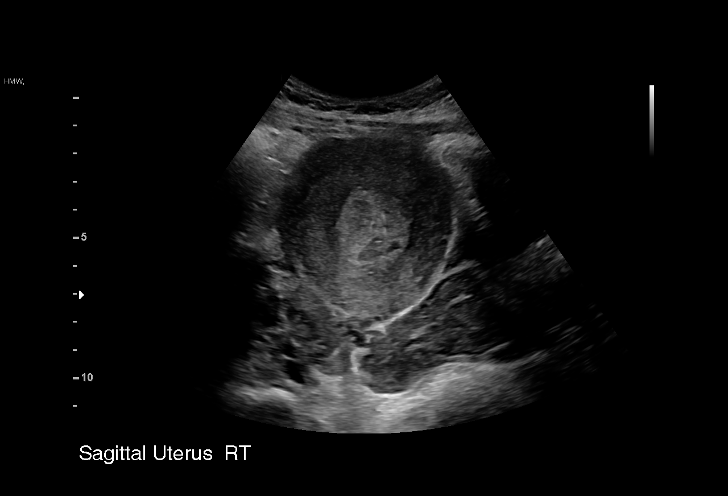
[im 5/28]
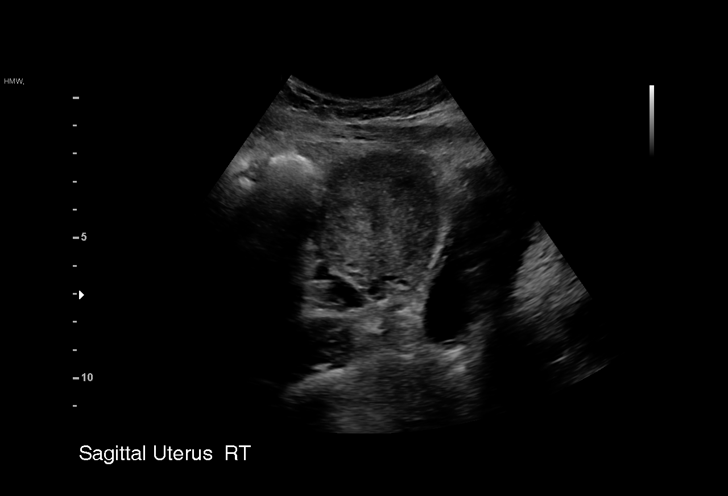
[im 7/28]
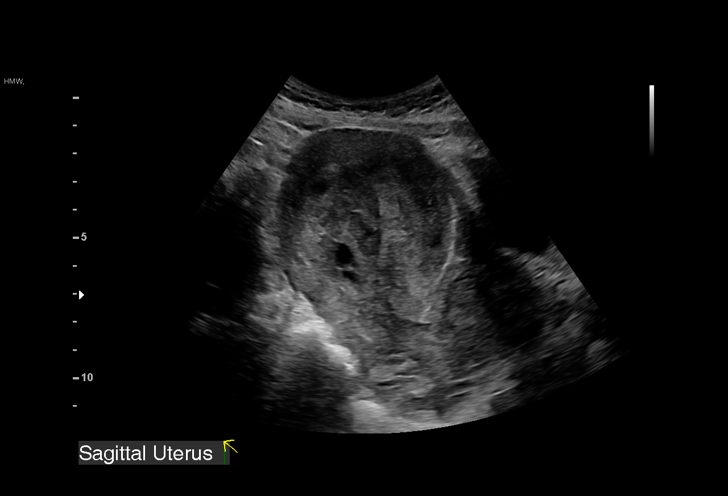
[im 9/28]
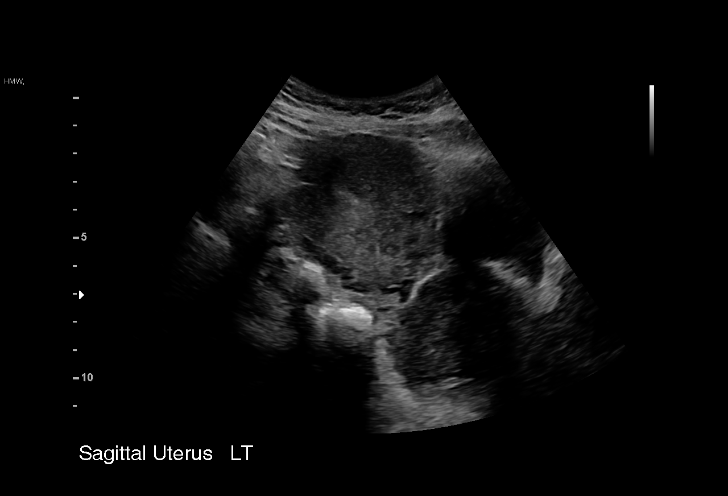
[im 11/28]
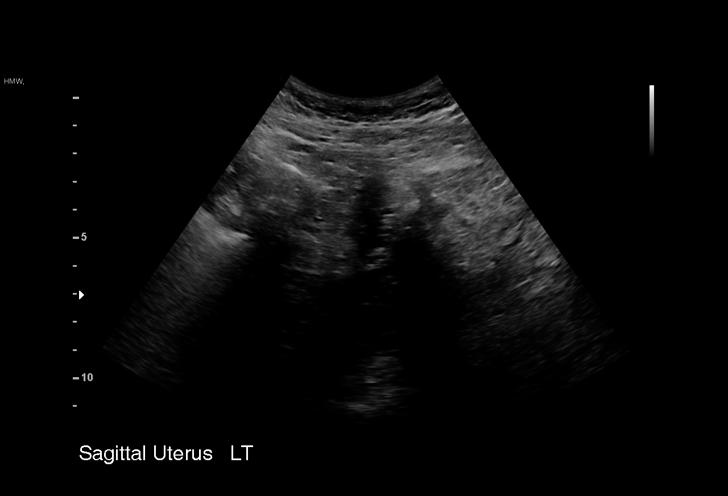
[im 13/28]
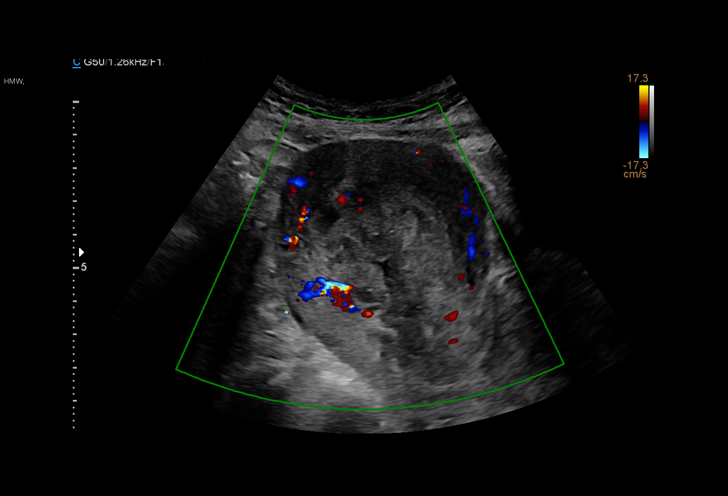
[im 15/28]
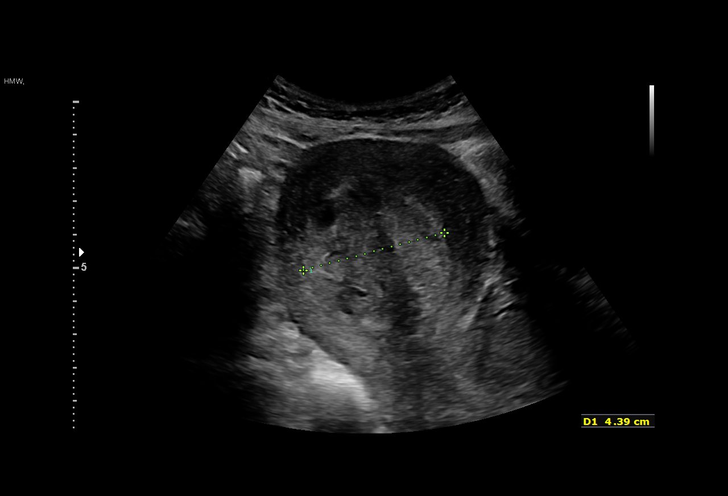
[im 16/28]
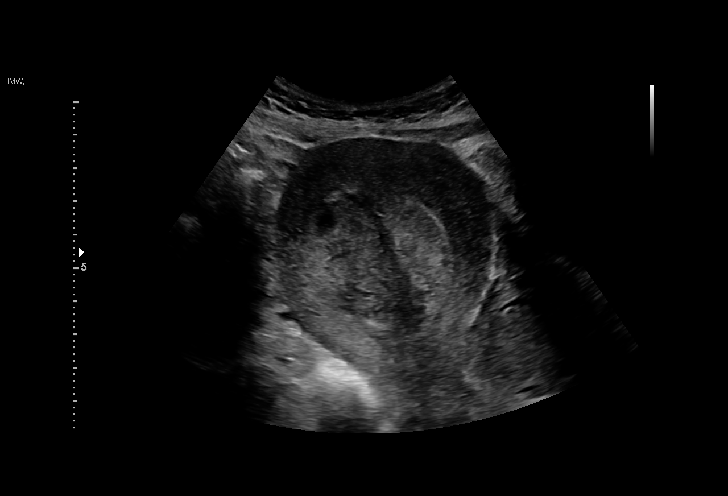
[im 18/28]
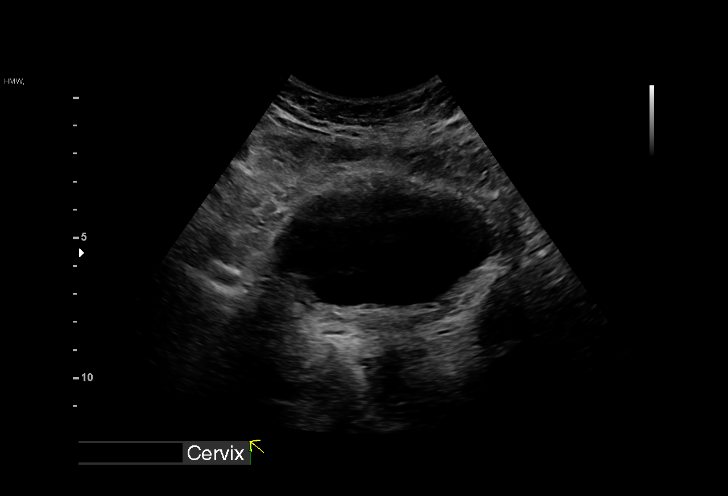
[im 20/28]
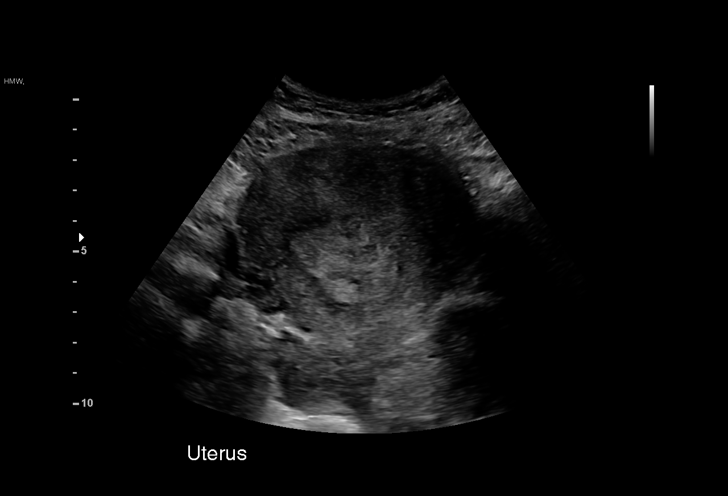
[im 22/28]
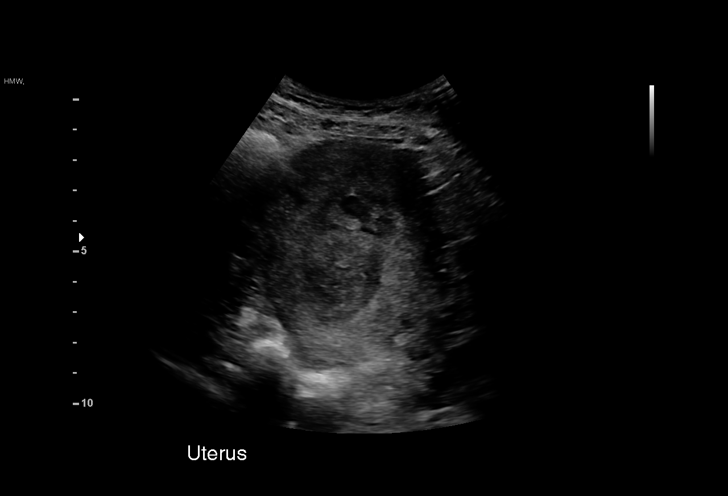
[im 24/28]
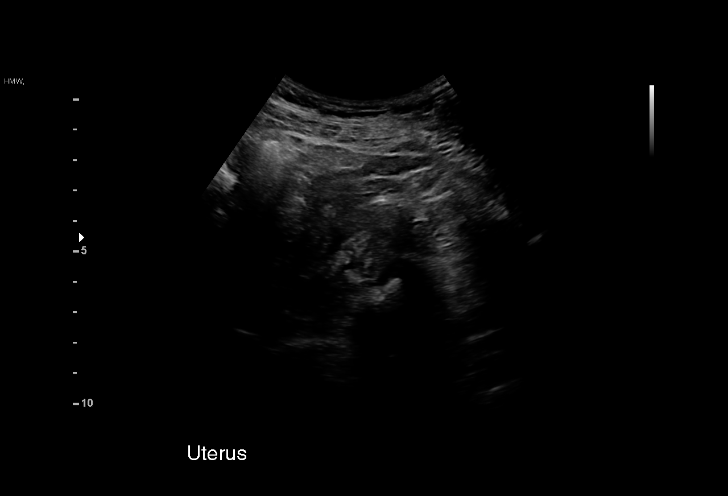
[im 26/28]
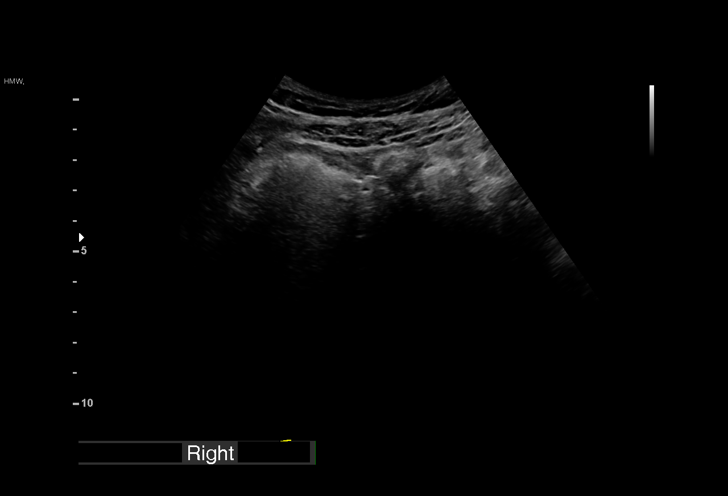
[im 28/28]
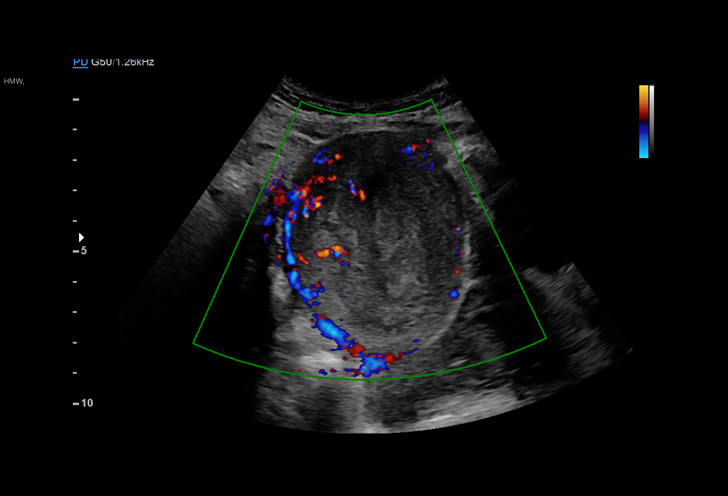

[15 of 28 positions shown; findings below may reference images not displayed]

FINDINGS: Intrauterine gestational sac: Not visualized

Yolk sac:  Not visualized

Embryo:  Not visualized

Cardiac Activity: Not visualized

Subchorionic hemorrhage:  None visualized.

Maternal uterus/adnexae: The endometrium is thickened at 4.3 cm
diameter with diffuse irregularity and hypervascularity. Cervical os
appears closed.

Neither ovary is visualized. There is no extrauterine pelvic mass
evident. No free pelvic fluid.
IMPRESSION: No intrauterine gestation. Endometrium is thickened and diffusely
inhomogeneous appearance. There is vascularity in this area. While
the changes within the endometrium may be entirely due to hemorrhage
secondary to recent spontaneous abortion, a degree of retained
products of conception cannot be entirely excluded. In this regard,
close assessment of beta HCG advised. This degree of thickening and
irregularity may warrant tissue sampling.

Study otherwise unremarkable. Note that beta HCG should be followed
to level of 0.

## 2020-10-06 DIAGNOSIS — Z13220 Encounter for screening for lipoid disorders: Secondary | ICD-10-CM | POA: Diagnosis not present

## 2020-10-06 DIAGNOSIS — L989 Disorder of the skin and subcutaneous tissue, unspecified: Secondary | ICD-10-CM | POA: Diagnosis not present

## 2020-10-06 DIAGNOSIS — Z0000 Encounter for general adult medical examination without abnormal findings: Secondary | ICD-10-CM | POA: Diagnosis not present

## 2020-11-04 DIAGNOSIS — F902 Attention-deficit hyperactivity disorder, combined type: Secondary | ICD-10-CM | POA: Diagnosis not present

## 2020-11-18 DIAGNOSIS — F902 Attention-deficit hyperactivity disorder, combined type: Secondary | ICD-10-CM | POA: Diagnosis not present

## 2020-12-01 DIAGNOSIS — F902 Attention-deficit hyperactivity disorder, combined type: Secondary | ICD-10-CM | POA: Diagnosis not present

## 2020-12-30 DIAGNOSIS — L905 Scar conditions and fibrosis of skin: Secondary | ICD-10-CM | POA: Diagnosis not present

## 2020-12-30 DIAGNOSIS — D224 Melanocytic nevi of scalp and neck: Secondary | ICD-10-CM | POA: Diagnosis not present

## 2021-01-04 DIAGNOSIS — F909 Attention-deficit hyperactivity disorder, unspecified type: Secondary | ICD-10-CM | POA: Diagnosis not present

## 2021-01-26 DIAGNOSIS — F909 Attention-deficit hyperactivity disorder, unspecified type: Secondary | ICD-10-CM | POA: Diagnosis not present

## 2021-02-09 DIAGNOSIS — F909 Attention-deficit hyperactivity disorder, unspecified type: Secondary | ICD-10-CM | POA: Diagnosis not present

## 2021-02-23 DIAGNOSIS — F909 Attention-deficit hyperactivity disorder, unspecified type: Secondary | ICD-10-CM | POA: Diagnosis not present

## 2021-03-08 DIAGNOSIS — L814 Other melanin hyperpigmentation: Secondary | ICD-10-CM | POA: Diagnosis not present

## 2021-03-08 DIAGNOSIS — Z23 Encounter for immunization: Secondary | ICD-10-CM | POA: Diagnosis not present

## 2021-03-08 DIAGNOSIS — Z808 Family history of malignant neoplasm of other organs or systems: Secondary | ICD-10-CM | POA: Diagnosis not present

## 2021-03-08 DIAGNOSIS — D229 Melanocytic nevi, unspecified: Secondary | ICD-10-CM | POA: Diagnosis not present

## 2021-03-08 DIAGNOSIS — L578 Other skin changes due to chronic exposure to nonionizing radiation: Secondary | ICD-10-CM | POA: Diagnosis not present

## 2021-03-08 DIAGNOSIS — D1801 Hemangioma of skin and subcutaneous tissue: Secondary | ICD-10-CM | POA: Diagnosis not present

## 2021-03-10 DIAGNOSIS — F909 Attention-deficit hyperactivity disorder, unspecified type: Secondary | ICD-10-CM | POA: Diagnosis not present

## 2021-03-24 DIAGNOSIS — F909 Attention-deficit hyperactivity disorder, unspecified type: Secondary | ICD-10-CM | POA: Diagnosis not present

## 2021-04-08 DIAGNOSIS — F909 Attention-deficit hyperactivity disorder, unspecified type: Secondary | ICD-10-CM | POA: Diagnosis not present

## 2021-10-07 DIAGNOSIS — Z975 Presence of (intrauterine) contraceptive device: Secondary | ICD-10-CM | POA: Diagnosis not present

## 2021-10-07 DIAGNOSIS — Z0000 Encounter for general adult medical examination without abnormal findings: Secondary | ICD-10-CM | POA: Diagnosis not present

## 2021-10-07 DIAGNOSIS — E559 Vitamin D deficiency, unspecified: Secondary | ICD-10-CM | POA: Diagnosis not present

## 2021-10-18 ENCOUNTER — Other Ambulatory Visit: Payer: Self-pay | Admitting: Nurse Practitioner

## 2021-10-18 ENCOUNTER — Other Ambulatory Visit (HOSPITAL_COMMUNITY)
Admission: RE | Admit: 2021-10-18 | Discharge: 2021-10-18 | Disposition: A | Discharge status: Home / Self Care | Payer: 59 | Source: Ambulatory Visit | Attending: Nurse Practitioner | Admitting: Nurse Practitioner

## 2021-10-18 DIAGNOSIS — T8332XA Displacement of intrauterine contraceptive device, initial encounter: Secondary | ICD-10-CM | POA: Diagnosis not present

## 2021-10-18 DIAGNOSIS — Z124 Encounter for screening for malignant neoplasm of cervix: Secondary | ICD-10-CM | POA: Insufficient documentation

## 2021-10-18 DIAGNOSIS — Z01419 Encounter for gynecological examination (general) (routine) without abnormal findings: Secondary | ICD-10-CM | POA: Diagnosis not present

## 2021-10-21 LAB — CYTOLOGY - PAP
Comment: NEGATIVE
Diagnosis: NEGATIVE
High risk HPV: NEGATIVE

## 2021-11-02 DIAGNOSIS — T8339XA Other mechanical complication of intrauterine contraceptive device, initial encounter: Secondary | ICD-10-CM | POA: Diagnosis not present

## 2022-01-27 ENCOUNTER — Telehealth: Payer: 59 | Admitting: Family Medicine

## 2022-01-27 ENCOUNTER — Other Ambulatory Visit (HOSPITAL_COMMUNITY): Payer: Self-pay

## 2022-01-27 DIAGNOSIS — R300 Dysuria: Secondary | ICD-10-CM | POA: Diagnosis not present

## 2022-01-27 MED ORDER — NITROFURANTOIN MONOHYD MACRO 100 MG PO CAPS
100.0000 mg | ORAL_CAPSULE | Freq: Two times a day (BID) | ORAL | 0 refills | Status: AC
  Filled 2022-01-27: qty 10, 5d supply, fill #0

## 2022-01-27 NOTE — Progress Notes (Signed)

## 2022-05-16 DIAGNOSIS — H1032 Unspecified acute conjunctivitis, left eye: Secondary | ICD-10-CM

## 2022-05-16 DIAGNOSIS — F909 Attention-deficit hyperactivity disorder, unspecified type: Secondary | ICD-10-CM | POA: Insufficient documentation

## 2022-05-16 DIAGNOSIS — Z87440 Personal history of urinary (tract) infections: Secondary | ICD-10-CM | POA: Insufficient documentation

## 2022-05-16 DIAGNOSIS — R519 Headache, unspecified: Secondary | ICD-10-CM | POA: Insufficient documentation

## 2022-05-16 HISTORY — DX: Unspecified acute conjunctivitis, left eye: H10.32

## 2022-10-21 ENCOUNTER — Other Ambulatory Visit: Payer: Self-pay | Admitting: Oncology

## 2022-10-21 DIAGNOSIS — Z006 Encounter for examination for normal comparison and control in clinical research program: Secondary | ICD-10-CM

## 2023-06-18 ENCOUNTER — Telehealth: Admitting: Family

## 2023-06-18 DIAGNOSIS — R399 Unspecified symptoms and signs involving the genitourinary system: Secondary | ICD-10-CM | POA: Insufficient documentation

## 2023-06-18 MED ORDER — CEPHALEXIN 500 MG PO CAPS: 500.0000 mg | ORAL_CAPSULE | Freq: Two times a day (BID) | ORAL | 0 refills | Status: DC

## 2023-06-18 NOTE — Progress Notes (Signed)

## 2023-11-29 ENCOUNTER — Ambulatory Visit: Admitting: Family Medicine

## 2023-11-29 VITALS — BP 96/62 | HR 80 | Resp 16 | Ht 66.0 in | Wt 155.0 lb

## 2023-11-29 DIAGNOSIS — Z13220 Encounter for screening for lipoid disorders: Secondary | ICD-10-CM

## 2023-11-29 DIAGNOSIS — Z131 Encounter for screening for diabetes mellitus: Secondary | ICD-10-CM | POA: Diagnosis not present

## 2023-11-29 DIAGNOSIS — Z1329 Encounter for screening for other suspected endocrine disorder: Secondary | ICD-10-CM

## 2023-11-29 DIAGNOSIS — Z0000 Encounter for general adult medical examination without abnormal findings: Secondary | ICD-10-CM

## 2023-11-29 DIAGNOSIS — Z1159 Encounter for screening for other viral diseases: Secondary | ICD-10-CM

## 2023-11-29 DIAGNOSIS — Z114 Encounter for screening for human immunodeficiency virus [HIV]: Secondary | ICD-10-CM | POA: Diagnosis not present

## 2023-11-29 DIAGNOSIS — E559 Vitamin D deficiency, unspecified: Secondary | ICD-10-CM | POA: Diagnosis not present

## 2023-11-29 DIAGNOSIS — Z23 Encounter for immunization: Secondary | ICD-10-CM

## 2023-11-29 NOTE — Progress Notes (Signed)
 Subjective:   Ashley Mathis 05-Jan-1986  11/29/2023   CC: Chief Complaint  Patient presents with   Annual Exam   Discussed the use of AI scribe software for clinical note transcription with the patient, who gave verbal consent to proceed.  HPI: Ashley Mathis is a 38 y.o. female who presents for a routine health maintenance exam.  Labs (non-fasting) collected at time of visit.   HEALTH SCREENINGS: - Vision Screening: up to date - Dental Visits: up to date - Pap smear: up to date - Breast Exam: SBEs explained  - STD Screening: Declined - Mammogram (40+): Not applicable  - Colonoscopy (45+): Not applicable  - Bone Density (65+ or under 65 with predisposing conditions): Not applicable  - Lung CA screening with low-dose CT:  Not applicable Adults age 89-80 who are current cigarette smokers or quit within the last 15 years. Must have 20 pack year history.   She is up to date with her cancer screenings, including a Pap smear with HPV testing done in 2023, which was negative. She is not yet at the age for mammograms or colon cancer screening. She has no interest in STD testing at this time.  Her family history is significant for her father having multiple cancers, including several skin cancers, a kidney tumor that was thought to be benign, and liposarcoma with tumors on his back and chest. There is no family history of breast cancer on her mother's side.  She has not received the HPV vaccine. Her tetanus vaccine was last administered in 2021. She plans to receive her flu vaccine at work when it becomes available.  She had breakfast this morning, which may affect her cholesterol panel. Her last lipid panel was done approximately two and a half years ago with a different provider, where it was noted that her vitamin D  level was low. She is not fasting today.  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 She recently saw an eye doctor due to having 'really bad vision.'   Depression and Anxiety Screen done today and results listed below:     11/29/2023    8:19 AM  Depression screen PHQ 2/9  Decreased Interest 0  Down, Depressed, Hopeless 0  PHQ - 2 Score 0  Altered sleeping 0  Tired, decreased energy 0  Change in appetite 0  Feeling bad or failure about yourself  0  Trouble concentrating 0  Moving slowly or fidgety/restless 0  Suicidal thoughts 0  PHQ-9 Score 0  Difficult doing work/chores Not difficult at all      11/29/2023    8:19 AM  GAD 7 : Generalized Anxiety Score  Nervous, Anxious, on Edge 0  Control/stop worrying 0  Worry too much - different things 0  Trouble relaxing 1  Restless 0  Easily annoyed or irritable 0  Afraid - awful might happen 0  Total GAD 7 Score 1  Anxiety Difficulty Not difficult at all    IMMUNIZATIONS: - Tdap: Tetanus vaccination status reviewed: up to date  - HPV: Refused - Influenza: will get soon - Pneumovax: Not applicable - Prevnar 20: Not applicable - Zostavax (50+): Not applicable   Past medical history, surgical history, medications, allergies, family history and social history reviewed with patient today and changes made to appropriate areas of the chart.   History reviewed. No pertinent past medical history.  History reviewed. No pertinent surgical history.  No current outpatient medications on file prior to visit.   No current facility-administered medications on file prior to visit.    No Known Allergies   Social History   Socioeconomic History   Marital status: Married    Spouse name: Not on file   Number of children: Not on file   Years of education: Not on file   Highest education level: Master's degree (e.g., MA, MS, MEng, MEd, MSW, MBA)  Occupational History   Not on file  Tobacco Use   Smoking status: Never   Smokeless tobacco: Not on file  Substance and Sexual Activity   Alcohol use:  Yes    Alcohol/week: 2.0 standard drinks of alcohol    Types: 1 Glasses of wine, 1 Cans of beer per week   Drug use: No   Sexual activity: Yes    Birth control/protection: I.U.D.  Other Topics Concern   Not on file  Social History Narrative   Not on file   Social Drivers of Health   Financial Resource Strain: Low Risk  (11/29/2023)   Overall Financial Resource Strain (CARDIA)    Difficulty of Paying Living Expenses: Not very hard  Food Insecurity: No Food Insecurity (11/29/2023)   Hunger Vital Sign    Worried About Running Out of Food in the Last Year: Never true    Ran Out of Food in the Last Year: Never true  Transportation Needs: No Transportation Needs (11/29/2023)   PRAPARE - Administrator, Civil Service (Medical): No    Lack of Transportation (Non-Medical): No  Physical Activity: Insufficiently Active (11/29/2023)   Exercise Vital Sign    Days of Exercise per Week: 2 days    Minutes of Exercise per Session: 40 min  Stress: No Stress Concern Present (11/29/2023)   [employer] of Occupational Health - Occupational Stress Questionnaire    Feeling of Stress: Only a little  Social Connections: Socially Integrated (11/29/2023)   Social Connection and Isolation Panel    Frequency of Communication with Friends and Family: More than three times a week    Frequency of Social Gatherings with Friends and Family: More than three times a week    Attends Religious Services: More than 4 times per year    Active Member of [employer] or Organizations: Yes    Attends Engineer, structural: More than 4 times per year    Marital Status: Married  Catering manager Violence: Not on file   Social History   Tobacco Use  Smoking Status Never  Smokeless Tobacco Not on file   Social History   Substance and Sexual Activity  Alcohol Use Yes   Alcohol/week: 2.0 standard drinks of alcohol   Types: 1 Glasses of wine, 1 Cans of beer per week    Family History  Problem  Relation Age of Onset   Birth defects Mother    Miscarriages / [place] Mother    Cancer Father        kidney, skin, liposarcoma- back & chest   ADD / ADHD Sister    Anxiety disorder Sister    Depression Sister    Diabetes Maternal Grandfather    Hyperlipidemia Paternal Grandmother    Hypertension Paternal Grandmother    Heart disease Paternal Grandfather    Hyperlipidemia Paternal Grandfather    ROS: Denies fever, fatigue, unexplained weight loss/gain, chest pain, SHOB, and palpitations. Denies neurological deficits, gastrointestinal or genitourinary complaints, and skin changes.   Objective:  Today's Vitals   11/29/23 0809  BP: 96/62  Pulse: 80  Resp: 16  Weight: 155 lb (70.3 kg)  Height: 5' 6 (1.676 m)  PainSc: 0-No pain    GENERAL APPEARANCE: Well-appearing, in NAD. Well nourished.  SKIN: Pink, warm and dry. Turgor normal. No rash, lesion, ulceration, or ecchymoses. Hair evenly distributed.  HEENT: HEAD: Normocephalic.  EYES: PERRLA. EOMI. Lids intact w/o defect. Sclera white, Conjunctiva pink w/o exudate.  EARS: External ear w/o redness, swelling, masses or lesions. EAC clear. TM's intact, translucent w/o bulging, appropriate landmarks visualized. Appropriate acuity to conversational tones.  NOSE: Septum midline w/o deformity. Nares patent, mucosa pink and non-inflamed w/o drainage. No sinus tenderness.  THROAT: Uvula midline. Oropharynx clear. Tonsils non-inflamed w/o exudate. Oral mucosa pink and moist.  NECK: Supple, Trachea midline. Full ROM w/o pain or tenderness. No lymphadenopathy. Thyroid non-tender w/o enlargement or palpable masses.  BREASTS: Deferred.  RESPIRATORY: Chest wall symmetrical w/o masses. Respirations even and non-labored. Breath sounds clear to auscultation bilaterally. No wheezes, rales, rhonchi, or crackles. CARDIAC: S1, S2 present, regular rate and rhythm. No gallops, murmurs, rubs, or clicks. PMI w/o lifts, heaves, or thrills. No carotid  bruits. Capillary refill <2 seconds. Peripheral pulses 2+ bilaterally. GI: Abdomen soft w/o distention. Normoactive bowel sounds. No palpable masses or tenderness. No guarding or rebound tenderness. Liver and spleen w/o tenderness or enlargement. No CVA tenderness.  GU: Deferred.  MSK: Muscle tone and strength appropriate for age, w/o atrophy or abnormal movement.  EXTREMITIES: Active ROM intact, w/o tenderness, crepitus, or contracture. No obvious joint deformities or effusions. No clubbing, edema, or cyanosis.  NEUROLOGIC: CN's II-XII intact. Motor strength symmetrical with no obvious weakness. No sensory deficits. DTR's 2+ symmetric bilaterally. Steady, even gait.  PSYCH/MENTAL STATUS: Alert, oriented x 3. Cooperative, appropriate mood and affect.   Results for orders placed or performed in visit on 10/18/21  Cytology - PAP   Collection Time: 10/18/21 12:00 AM  Result Value Ref Range   High risk HPV Negative    Adequacy      Satisfactory for evaluation; transformation zone component PRESENT.   Diagnosis      - Negative for intraepithelial lesion or malignancy (NILM)   Comment      A letter was sent to the patient informing her of the above results.   Comment Normal Reference Range HPV - Negative     Assessment & Plan:   1. Wellness examination (Primary) Routine wellness visit for a 38 year old female. Normal vital signs and mood scores. No family history of breast cancer. Recent eye exam completed. - Encouraged a healthy well-balanced diet. Patient may adjust caloric intake to maintain or achieve ideal body weight. May reduce intake of dietary saturated fat and total fat and have adequate dietary potassium and calcium preferably from fresh fruits, vegetables, and low-fat dairy products.   - Advised to avoid cigarette smoking. - Discussed with the patient that most people either abstain from alcohol or drink within safe limits (<=14/week and <=4 drinks/occasion for males, <=7/weeks and  <= 3 drinks/occasion for females) and that the risk for alcohol disorders and other health effects rises proportionally with the number of drinks per week and how often a drinker exceeds daily limits. - Discussed cessation/primary prevention of drug use and availability of treatment for abuse.  - Discussed sexually transmitted diseases, avoidance of unintended pregnancy and contraceptive alternatives. - Stressed the importance of regular exercise - Injury prevention: Discussed safety belts, safety helmets, smoke detector, smoking near bedding  or upholstery.  - Dental health: Discussed importance of regular tooth brushing, flossing, and dental visits.  - Order CBC, CMP, A1c, lipid panel, and vitamin D  level. - Instructed to perform monthly self-breast exams post-menstrual cycle. - Plans to receive flu vaccine at work.  2. Healthcare maintenance See #1 - CBC with Differential/Platelet - Comprehensive metabolic panel with GFR - Hemoglobin A1c - Lipid panel  3. Screening for thyroid disorder Order placed for thyroid disorder. - TSH Rfx on Abnormal to Free T4  4. Need for hepatitis C screening test The [place]  Preventive Services Task Force (USPSTF) recommends hepatitis C screening for adults age 34-79. Will complete hepatitis C screening with labs today and update patient with results.  - Hepatitis C Antibody  5. Screening for HIV (human immunodeficiency virus) The [place]  Preventive Services Task Force (USPSTF) recommends HIV screening for adults age 56-65. Will complete HIV screening with labs today and update patient with results.  - HIV antibody (with reflex)  6. Vitamin D  deficiency Vitamin D  deficiency noted from previous labs. Order vitamin D  level.  - VITAMIN D  25 Hydroxy (Vit-D Deficiency, Fractures)  7. Screening for lipid disorders Will obtain non-fasting lipids today.  - Lipid panel  8. Screening for diabetes mellitus Will complete A1c today.  - Hemoglobin  A1c   NEXT PREVENTATIVE PHYSICAL DUE IN 1 YEAR.  Return in about 1 year (around 11/28/2024) for Physical with fasting labs.  Patient to reach out to office if new, worrisome, or unresolved symptoms arise or if no improvement in patient's condition. Patient verbalized understanding and is agreeable to treatment plan. All questions answered to patient's satisfaction.   Evalene Arts, FNP

## 2023-11-29 NOTE — Patient Instructions (Signed)
 Health Maintenance Recommendations Screening Testing Mammogram Every 1 -2 years based on history and risk factors Starting at age 37 Pap Smear Ages 21-39 every 3 years Ages 23-65 every 5 years with HPV testing More frequent testing may be required based on results and history Colon Cancer Screening Every 1-10 years based on test performed, risk factors, and history Starting at age 102 Bone Density Screening Every 2-10 years based on history Starting at age 69 for women Recommendations for men differ based on medication usage, history, and risk factors AAA Screening One time ultrasound Men 30-10 years old who have every smoked Lung Cancer Screening Low Dose Lung CT every 12 months Age 20-80 years with a 30 pack-year smoking history who still smoke or who have quit within the last 15 years   Screening Labs Routine  Labs: Complete Blood Count (CBC), Complete Metabolic Panel (CMP), Cholesterol (Lipid Panel) Every 6-12 months based on history and medications May be recommended more frequently based on current conditions or previous results Hemoglobin A1c Lab Every 3-12 months based on history and previous results Starting at age 24 or earlier with diagnosis of diabetes, high cholesterol, BMI >26, and/or risk factors Frequent monitoring for patients with diabetes to ensure blood sugar control Thyroid Panel (TSH w/ T3 & T4) Every 6 months based on history, symptoms, and risk factors May be repeated more often if on medication HIV One time testing for all patients 23 and older May be repeated more frequently for patients with increased risk factors or exposure Hepatitis C One time testing for all patients 47 and older May be repeated more frequently for patients with increased risk factors or exposure Gonorrhea, Chlamydia Every 12 months for all sexually active persons 13-24 years Additional monitoring may be recommended for those who are considered high risk or who have  symptoms PSA Men 72-66 years old with risk factors Additional screening may be recommended from age 2-69 based on risk factors, symptoms, and history   Vaccine Recommendations Tetanus Booster All adults every 10 years Flu Vaccine All patients 6 months and older every year COVID Vaccine All patients 12 years and older Initial dosing with booster May recommend additional booster based on age and health history HPV Vaccine 2 doses all patients age 56-26 Dosing may be considered for patients over 26 Shingles Vaccine (Shingrix) 2 doses all adults 55 years and older Pneumonia (Pneumovax 23) All adults 65 years and older May recommend earlier dosing based on health history Pneumonia (Prevnar 16) All adults 65 years and older Dosed 1 year after Pneumovax 23   Additional Screening, Testing, and Vaccinations may be recommended on an individualized basis based on family history, health history, risk factors, and/or exposure.  __________________________________________________________   Diet Recommendations for All Patients   I recommend that all patients maintain a diet low in saturated fats, carbohydrates, and cholesterol. While this can be challenging at first, it is not impossible and small changes can make big differences.  Things to try: Decreasing the amount of soda, sweet tea, and/or juice to one or less per day and replace with water While water is always the first choice, if you do not like water you may consider adding a water additive without sugar to improve the taste other sugar free drinks Replace potatoes with a brightly colored vegetable at dinner Use healthy oils, such as canola oil or olive oil, instead of butter or hard margarine Limit your bread intake to two pieces or less a day Replace regular pasta with  low carb pasta options Bake, broil, or grill foods instead of frying Monitor portion sizes  Eat smaller, more frequent meals throughout the day instead of large  meals   An important thing to remember is, if you love foods that are not great for your health, you don't have to give them up completely. Instead, allow these foods to be a reward when you have done well. Allowing yourself to still have special treats every once in a while is a nice way to tell yourself thank you for working hard to keep yourself healthy.    Also remember that every day is a new day. If you have a bad day and "fall off the wagon", you can still climb right back up and keep moving along on your journey!   We have resources available to help you!  Some websites that may be helpful include: www.http://www.wall-moore.info/        Www.VeryWellFit.com _____________________________________________________________   Activity Recommendations for All Patients   I recommend that all adults get at least 20 minutes of moderate physical activity that elevates your heart rate at least 5 days out of the week.  Some examples include: Walking or jogging at a pace that allows you to carry on a conversation Cycling (stationary bike or outdoors) Water aerobics Yoga Weight lifting Dancing If physical limitations prevent you from putting stress on your joints, exercise in a pool or seated in a chair are excellent options.   Do determine your MAXIMUM heart rate for activity: YOUR AGE - 220 = MAX HeartRate    Remember! Do not push yourself too hard.  Start slowly and build up your pace, speed, weight, time in exercise, etc.  Allow your body to rest between exercise and get good sleep. You will need more water than normal when you are exerting yourself. Do not wait until you are thirsty to drink. Drink with a purpose of getting in at least 8, 8 ounce glasses of water a day plus more depending on how much you exercise and sweat.      If you begin to develop dizziness, chest pain, abdominal pain, jaw pain, shortness of breath, headache, vision changes, lightheadedness, or other concerning symptoms, stop the  activity and allow your body to rest. If your symptoms are severe, seek emergency evaluation immediately. If your symptoms are concerning, but not severe, please let us know so that we can recommend further evaluation.

## 2023-11-30 ENCOUNTER — Ambulatory Visit: Payer: Self-pay | Admitting: Family Medicine

## 2023-11-30 LAB — COMPREHENSIVE METABOLIC PANEL WITH GFR
ALT: 11 IU/L (ref 0–32)
AST: 17 IU/L (ref 0–40)
Albumin: 4.5 g/dL (ref 3.9–4.9)
Alkaline Phosphatase: 50 IU/L (ref 44–121)
BUN/Creatinine Ratio: 13 (ref 9–23)
BUN: 10 mg/dL (ref 6–20)
Bilirubin Total: 1.2 mg/dL (ref 0.0–1.2)
CO2: 23 mmol/L (ref 20–29)
Calcium: 9.3 mg/dL (ref 8.7–10.2)
Chloride: 102 mmol/L (ref 96–106)
Creatinine, Ser: 0.79 mg/dL (ref 0.57–1.00)
Globulin, Total: 2.2 g/dL (ref 1.5–4.5)
Glucose: 65 mg/dL — ABNORMAL LOW (ref 70–99)
Potassium: 4 mmol/L (ref 3.5–5.2)
Sodium: 138 mmol/L (ref 134–144)
Total Protein: 6.7 g/dL (ref 6.0–8.5)
eGFR: 99 mL/min/1.73 (ref >59)

## 2023-11-30 LAB — CBC WITH DIFFERENTIAL/PLATELET
Basophils Absolute: 0.1 x10E3/uL (ref 0.0–0.2)
Basos: 1 %
EOS (ABSOLUTE): 0.1 x10E3/uL (ref 0.0–0.4)
Eos: 2 %
Hematocrit: 42.1 % (ref 34.0–46.6)
Hemoglobin: 13.7 g/dL (ref 11.1–15.9)
Immature Grans (Abs): 0 x10E3/uL (ref 0.0–0.1)
Immature Granulocytes: 0 %
Lymphocytes Absolute: 2.1 x10E3/uL (ref 0.7–3.1)
Lymphs: 42 %
MCH: 30.3 pg (ref 26.6–33.0)
MCHC: 32.5 g/dL (ref 31.5–35.7)
MCV: 93 fL (ref 79–97)
Monocytes Absolute: 0.4 x10E3/uL (ref 0.1–0.9)
Monocytes: 7 %
Neutrophils Absolute: 2.4 x10E3/uL (ref 1.4–7.0)
Neutrophils: 48 %
Platelets: 209 x10E3/uL (ref 150–450)
RBC: 4.52 x10E6/uL (ref 3.77–5.28)
RDW: 12.3 % (ref 11.7–15.4)
WBC: 5 x10E3/uL (ref 3.4–10.8)

## 2023-11-30 LAB — LIPID PANEL
Chol/HDL Ratio: 3.2 ratio (ref 0.0–4.4)
Cholesterol, Total: 167 mg/dL (ref 100–199)
HDL: 52 mg/dL (ref >39)
LDL Chol Calc (NIH): 98 mg/dL (ref 0–99)
Triglycerides: 90 mg/dL (ref 0–149)
VLDL Cholesterol Cal: 17 mg/dL (ref 5–40)

## 2023-11-30 LAB — TSH RFX ON ABNORMAL TO FREE T4: TSH: 2.83 u[IU]/mL (ref 0.450–4.500)

## 2023-11-30 LAB — VITAMIN D 25 HYDROXY (VIT D DEFICIENCY, FRACTURES): Vit D, 25-Hydroxy: 29.8 ng/mL — ABNORMAL LOW (ref 30.0–100.0)

## 2023-11-30 LAB — HEMOGLOBIN A1C
Est. average glucose Bld gHb Est-mCnc: 100 mg/dL
Hgb A1c MFr Bld: 5.1 % (ref 4.8–5.6)

## 2023-11-30 LAB — HEPATITIS C ANTIBODY: Hep C Virus Ab: NONREACTIVE

## 2023-11-30 LAB — HIV ANTIBODY (ROUTINE TESTING W REFLEX): HIV Screen 4th Generation wRfx: NONREACTIVE

## 2024-01-22 ENCOUNTER — Other Ambulatory Visit: Payer: Self-pay | Admitting: Medical Genetics

## 2024-01-22 DIAGNOSIS — Z006 Encounter for examination for normal comparison and control in clinical research program: Secondary | ICD-10-CM

## 2024-02-17 LAB — GENECONNECT MOLECULAR SCREEN: Genetic Analysis Overall Interpretation: NEGATIVE

## 2024-02-21 ENCOUNTER — Other Ambulatory Visit (HOSPITAL_COMMUNITY): Payer: Self-pay

## 2024-02-21 MED ORDER — CHLORHEXIDINE GLUCONATE 0.12 % MT SOLN
Freq: Two times a day (BID) | OROMUCOSAL | 0 refills | Status: AC
  Filled 2024-02-21: qty 473, 30d supply, fill #0

## 2024-02-21 MED ORDER — IBUPROFEN 400 MG PO TABS
400.0000 mg | ORAL_TABLET | ORAL | 0 refills | Status: AC
  Filled 2024-02-21: qty 30, 5d supply, fill #0

## 2024-02-21 MED ORDER — DEXAMETHASONE 4 MG PO TABS
4.0000 mg | ORAL_TABLET | Freq: Three times a day (TID) | ORAL | 0 refills | Status: AC | PRN
  Filled 2024-02-21: qty 9, 3d supply, fill #0

## 2024-02-21 MED ORDER — AMOXICILLIN 500 MG PO CAPS
500.0000 mg | ORAL_CAPSULE | Freq: Every day | ORAL | 0 refills | Status: AC
  Filled 2024-02-21: qty 7, 7d supply, fill #0

## 2024-02-27 ENCOUNTER — Other Ambulatory Visit (HOSPITAL_COMMUNITY): Payer: Self-pay

## 2024-03-04 ENCOUNTER — Other Ambulatory Visit (HOSPITAL_COMMUNITY): Payer: Self-pay

## 2024-05-10 ENCOUNTER — Encounter: Payer: Self-pay | Admitting: Family Medicine

## 2024-05-10 ENCOUNTER — Ambulatory Visit: Admitting: Family Medicine

## 2024-05-10 VITALS — BP 111/73 | HR 76 | Ht 65.5 in | Wt 159.0 lb

## 2024-05-10 DIAGNOSIS — R7989 Other specified abnormal findings of blood chemistry: Secondary | ICD-10-CM | POA: Diagnosis not present

## 2024-05-10 DIAGNOSIS — T8332XA Displacement of intrauterine contraceptive device, initial encounter: Secondary | ICD-10-CM | POA: Insufficient documentation

## 2024-05-10 DIAGNOSIS — Z87440 Personal history of urinary (tract) infections: Secondary | ICD-10-CM | POA: Diagnosis not present

## 2024-05-10 DIAGNOSIS — Z01419 Encounter for gynecological examination (general) (routine) without abnormal findings: Secondary | ICD-10-CM | POA: Diagnosis not present

## 2024-05-10 NOTE — Progress Notes (Deleted)
 "  GYNECOLOGY ANNUAL PREVENTATIVE CARE ENCOUNTER NOTE  Subjective:  Ashley Mathis is a 39 y.o. [id number] female here for a routine annual gynecologic exam.  Current complaints: ***.   Denies abnormal vaginal bleeding, discharge, pelvic pain, problems with intercourse or other gynecologic concerns.    Gynecologic History No LMP recorded. (Menstrual status: IUD). Contraception: {method:5051} Last Pap: ***. Results were: {norm/abn:16337} Last mammogram: ***. Results were: {norm/abn:16337}  Health Maintenance Due  Topic Date Due   Influenza Vaccine  11/10/2023   COVID-19 Vaccine (3 - 2025-26 season) 12/11/2023       11/29/2023    8:19 AM  GAD 7 : Generalized Anxiety Score  Nervous, Anxious, on Edge 0   Control/stop worrying 0   Worry too much - different things 0   Trouble relaxing 1   Restless 0   Easily annoyed or irritable 0   Afraid - awful might happen 0   Total GAD 7 Score 1  Anxiety Difficulty Not difficult at all     Data saved with a previous flowsheet row definition       11/29/2023    8:19 AM  Depression screen PHQ 2/9  Decreased Interest 0  Down, Depressed, Hopeless 0  PHQ - 2 Score 0  Altered sleeping 0  Tired, decreased energy 0  Change in appetite 0  Feeling bad or failure about yourself  0  Trouble concentrating 0  Moving slowly or fidgety/restless 0  Suicidal thoughts 0  PHQ-9 Score 0   Difficult doing work/chores Not difficult at all     Data saved with a previous flowsheet row definition    The following portions of the patient's history were reviewed and updated as appropriate: allergies, current medications, past family history, past medical history, past social history, past surgical history and problem list.  Review of Systems {ros; complete:30496}   Objective:  BP 111/73   Pulse 76   Ht 5' 5.5 (1.664 m)   Wt 159 lb (72.1 kg)   BMI 26.06 kg/m  CONSTITUTIONAL: Well-developed, well-nourished female in no acute distress.  HENT:   Normocephalic, atraumatic, External right and left ear normal. Oropharynx is clear and moist EYES:  No scleral icterus.  NECK: Normal range of motion, supple, no masses.  Normal thyroid.  SKIN: Skin is warm and dry. No rash noted. Not diaphoretic. No erythema. No pallor. NEUROLOGIC: Alert and oriented to person, place, and time. Normal reflexes, muscle tone coordination. No cranial nerve deficit noted. PSYCHIATRIC: Normal mood and affect. Normal behavior. Normal judgment and thought content. CARDIOVASCULAR: Normal heart rate noted, regular rhythm. 2+ distal pulses. RESPIRATORY: Effort and breath sounds normal, no problems with respiration noted. BREASTS: Symmetric in size. No masses, skin changes, nipple drainage, or lymphadenopathy. ABDOMEN: Soft,  no distention noted.  No tenderness, rebound or guarding.  PELVIC: Normal appearing external genitalia; normal appearing vaginal mucosa and cervix.  No abnormal discharge noted.  ***Pap smear obtained.  Normal uterine size, no other palpable masses, no uterine or adnexal tenderness. Chaperone present for exam MUSCULOSKELETAL: Normal range of motion.   PROCEDURE NOTE: (***if Indicated for IUD, Nexplanon etc)    Assessment and Plan:  1) Annual gynecologic examination ***with pap smear:  Will follow up results of pap smear and manage accordingly. ***STI screen also ordered today.  Routine preventative health maintenance measures emphasized.  2) Contraception counseling: Reviewed all forms of birth control options available including abstinence; over the counter/barrier methods; hormonal contraceptive medication including pill, patch, ring, injection,contraceptive implant; hormonal  and nonhormonal IUDs; permanent sterilization options including vasectomy and the various tubal sterilization modalities. Risks and benefits reviewed.  Questions were answered.  Written information was also given to the patient to review.  Patient desires ***, this was  prescribed for patient. She will follow up in  *** for surveillance.  She was told to call with any further questions, or with any concerns about this method of contraception.  Emphasized use of condoms 100% of the time for STI prevention.  1. Well woman exam with routine gynecological exam (Primary) ***   Please refer to After Visit Summary for other counseling recommendations.   No follow-ups on file.  Suzen Maryan Masters, MD, MPH, ABFM Attending Physician Center for [facility]  "

## 2024-05-10 NOTE — Progress Notes (Signed)
 Pt currently has Ashley Mathis- 2021

## 2024-05-10 NOTE — Progress Notes (Signed)
 "  GYNECOLOGY ANNUAL PREVENTATIVE CARE ENCOUNTER NOTE  Subjective:  Ashley Mathis is a 39 y.o. [id number] female here for a routine annual gynecologic exam.  Current complaints: establishing care, questions about Kyleena .    -Kyleena  placed in late 2021 -Partner got vasectomy but has not had negative sperm count yet -Reports she likes her bleeding with kyleena  which is minimal.  Reports dysuria with dehydration.   Denies abnormal vaginal bleeding, discharge, pelvic pain, problems with intercourse or other gynecologic concerns.    Gynecologic History No LMP recorded. (Menstrual status: IUD). Contraception: IUD Last Pap: 2023. Results were: normal  Health Maintenance Due  Topic Date Due   COVID-19 Vaccine (3 - 2025-26 season) 12/11/2023       11/29/2023    8:19 AM  GAD 7 : Generalized Anxiety Score  Nervous, Anxious, on Edge 0   Control/stop worrying 0   Worry too much - different things 0   Trouble relaxing 1   Restless 0   Easily annoyed or irritable 0   Afraid - awful might happen 0   Total GAD 7 Score 1  Anxiety Difficulty Not difficult at all     Data saved with a previous flowsheet row definition       11/29/2023    8:19 AM  Depression screen PHQ 2/9  Decreased Interest 0  Down, Depressed, Hopeless 0  PHQ - 2 Score 0  Altered sleeping 0  Tired, decreased energy 0  Change in appetite 0  Feeling bad or failure about yourself  0  Trouble concentrating 0  Moving slowly or fidgety/restless 0  Suicidal thoughts 0  PHQ-9 Score 0   Difficult doing work/chores Not difficult at all     Data saved with a previous flowsheet row definition    The following portions of the patient's history were reviewed and updated as appropriate: allergies, current medications, past family history, past medical history, past social history, past surgical history and problem list.  Review of Systems Pertinent items are noted in HPI.   Objective:  BP 111/73   Pulse 76   Ht 5' 5.5  (1.664 m)   Wt 159 lb (72.1 kg)   BMI 26.06 kg/m  CONSTITUTIONAL: Well-developed, well-nourished female in no acute distress.  HENT:  Normocephalic, atraumatic, External right and left ear normal. Oropharynx is clear and moist EYES:  No scleral icterus.  NECK: Normal range of motion, supple, no masses.  Normal thyroid.  SKIN: Skin is warm and dry. No rash noted. Not diaphoretic. No erythema. No pallor. NEUROLOGIC: Alert and oriented to person, place, and time. Normal reflexes, muscle tone coordination. No cranial nerve deficit noted. PSYCHIATRIC: Normal mood and affect. Normal behavior. Normal judgment and thought content. CARDIOVASCULAR: Normal heart rate noted, regular rhythm. 2+ distal pulses. RESPIRATORY: Effort and breath sounds normal, no problems with respiration noted. BREASTS: Symmetric in size. No masses, skin changes, nipple drainage, or lymphadenopathy. ABDOMEN: Soft,  no distention noted.  No tenderness, rebound or guarding.  PELVIC: deferred MUSCULOSKELETAL: Normal range of motion.    Assessment and Plan:  1) Annual gynecologic examination.  Routine preventative health maintenance measures emphasized.  2) Contraception counseling: Kyleena  is non-expired. Will need replacement in late 2026. Discussed waiting for negative sperm analysis and then consider replacement for bleeding and not contraception vs simple removal -Of note-- she reports lost strings having US  that showed device in uterus but might have a more complicated   1. Well woman exam with routine gynecological exam (Primary) -  VITAMIN D  25 Hydroxy (Vit-D Deficiency, Fractures) - Urine Culture - POCT Urinalysis Dipstick  2. Low vitamin D  level - VITAMIN D  25 Hydroxy (Vit-D Deficiency, Fractures)  3. Personal history of urinary (tract) infections - Urine Culture - POCT Urinalysis Dipstick   Please refer to After Visit Summary for other counseling recommendations.   No follow-ups on file.  Suzen Maryan Masters, MD, MPH, ABFM Attending [facility] for [facility]  "

## 2024-05-11 LAB — VITAMIN D 25 HYDROXY (VIT D DEFICIENCY, FRACTURES): Vit D, 25-Hydroxy: 33.3 ng/mL (ref 30.0–100.0)

## 2024-05-14 LAB — URINE CULTURE
